# Patient Record
Sex: Male | Born: 1955 | Race: White | Hispanic: No | Marital: Married | Smoking: Never smoker
Health system: Southern US, Community
[De-identification: ages and names within clinical notes are randomized; demographics above are authoritative.]

## PROBLEM LIST (undated history)

## (undated) DIAGNOSIS — E785 Hyperlipidemia, unspecified: Secondary | ICD-10-CM

## (undated) DIAGNOSIS — Z889 Allergy status to unspecified drugs, medicaments and biological substances status: Secondary | ICD-10-CM

## (undated) HISTORY — PX: WRIST SURGERY: SHX841

## (undated) HISTORY — DX: Hyperlipidemia, unspecified: E78.5

## (undated) HISTORY — DX: Allergy status to unspecified drugs, medicaments and biological substances: Z88.9

---

## 2004-09-24 ENCOUNTER — Emergency Department: Payer: Self-pay | Admitting: General Practice

## 2005-12-27 ENCOUNTER — Ambulatory Visit: Payer: Self-pay | Admitting: Unknown Physician Specialty

## 2011-01-25 ENCOUNTER — Ambulatory Visit: Payer: Self-pay | Admitting: Unknown Physician Specialty

## 2011-01-28 LAB — PATHOLOGY REPORT

## 2012-12-28 ENCOUNTER — Ambulatory Visit: Payer: Self-pay | Admitting: Family Medicine

## 2013-03-26 ENCOUNTER — Ambulatory Visit: Payer: Self-pay | Admitting: Unknown Physician Specialty

## 2013-03-26 LAB — HM COLONOSCOPY

## 2013-08-24 ENCOUNTER — Ambulatory Visit: Payer: Self-pay | Admitting: Family Medicine

## 2014-08-20 IMAGING — CT CT ABD-PELV W/ CM
2 of 5 series · 17 of 46 positions shown, 19 images · IV contrast (isovue)
Comparison: None.

CLINICAL DATA: Left upper quadrant abdominal pain x1 month, history
of diverticulitis

EXAM:
CT ABDOMEN AND PELVIS WITH CONTRAST
TECHNIQUE: Multidetector CT imaging of the abdomen and pelvis was performed
using the standard protocol following bolus administration of
intravenous contrast.
CONTRAST:  100 mL Isovue 370 IV

[Series 2: axial soft tissue · axial · 0.86mm/px · z∈[-876,-476]mm · 14 of 90 slices shown, 16 images]
[im 5/90  soft-tissue]
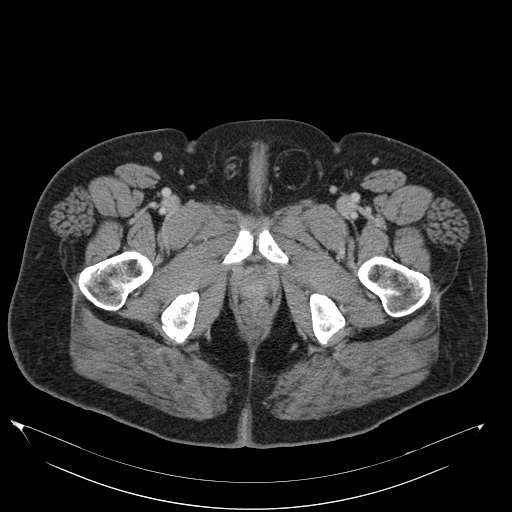
[im 5/90  bone]
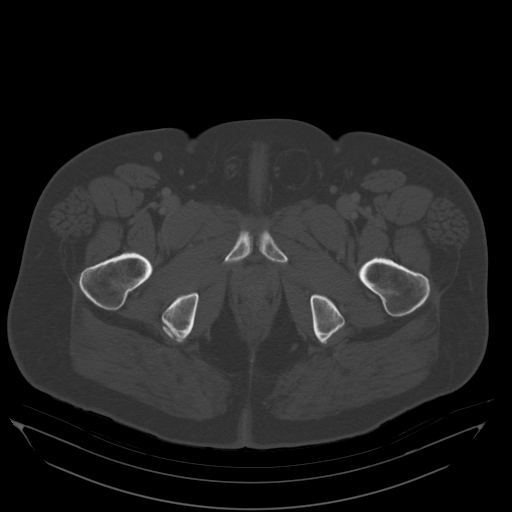
[im 10/90  soft-tissue]
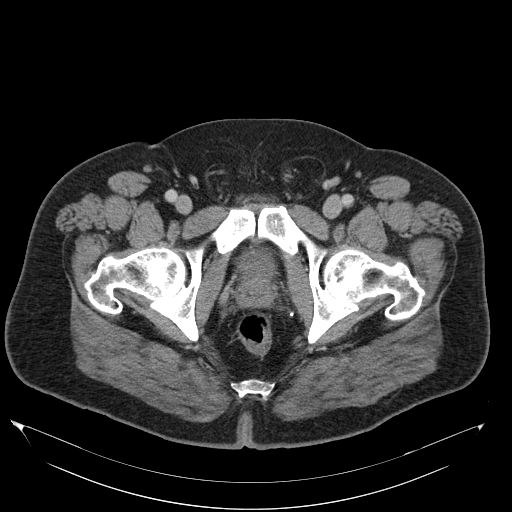
[im 19/90  soft-tissue]
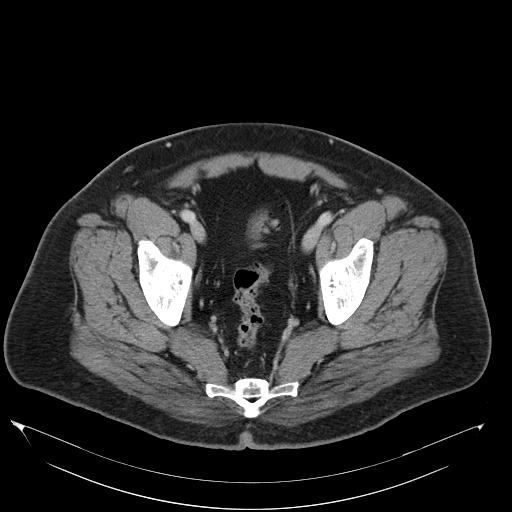
[im 24/90  soft-tissue]
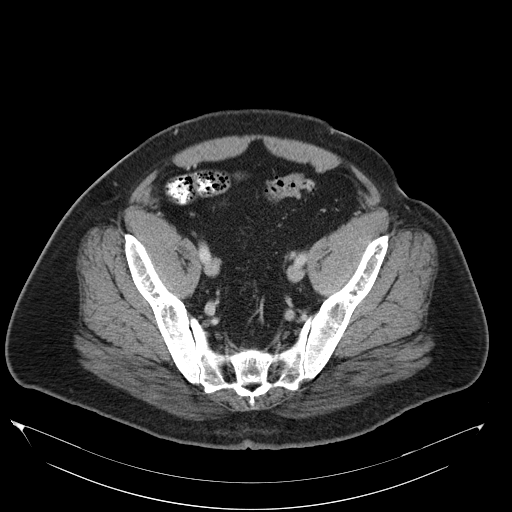
[im 29/90  soft-tissue]
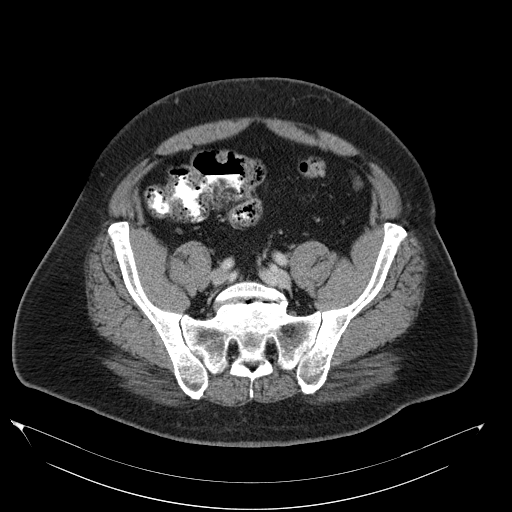
[im 38/90  soft-tissue]
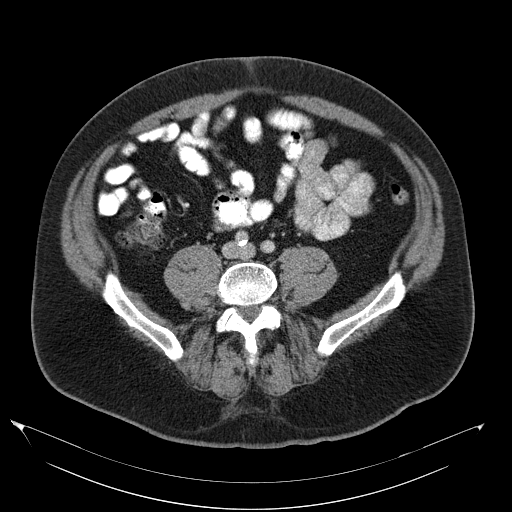
[im 43/90  soft-tissue]
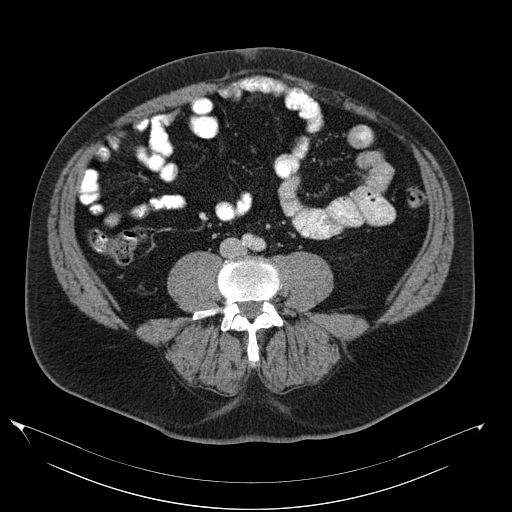
[im 47/90  soft-tissue]
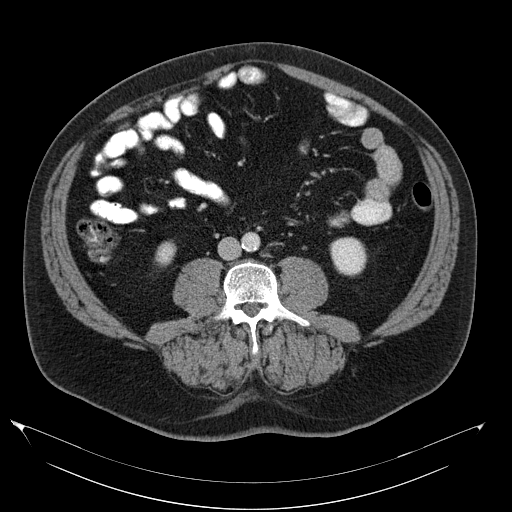
[im 52/90  soft-tissue]
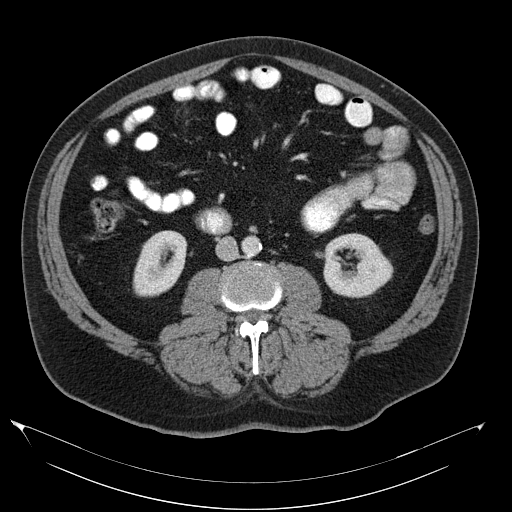
[im 52/90  bone]
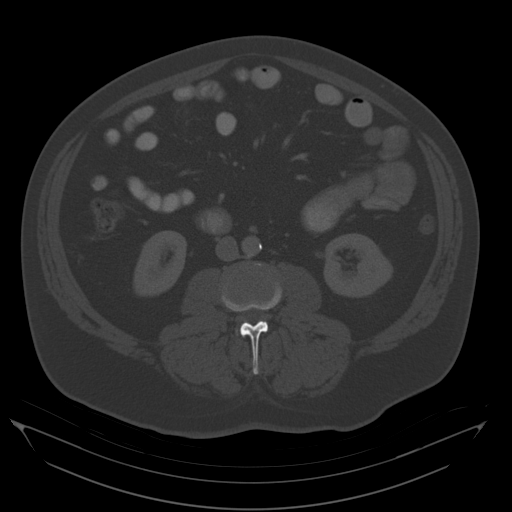
[im 61/90  soft-tissue]
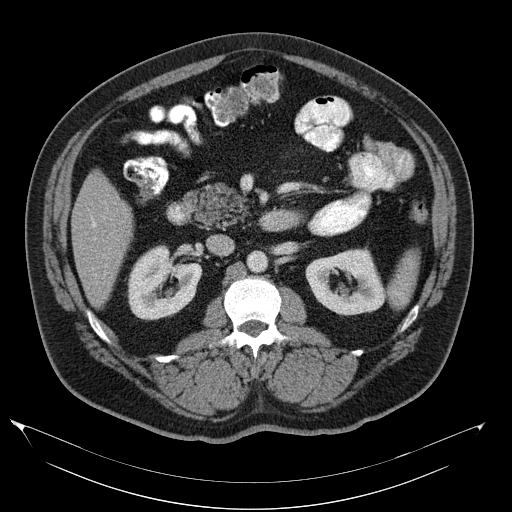
[im 66/90  soft-tissue]
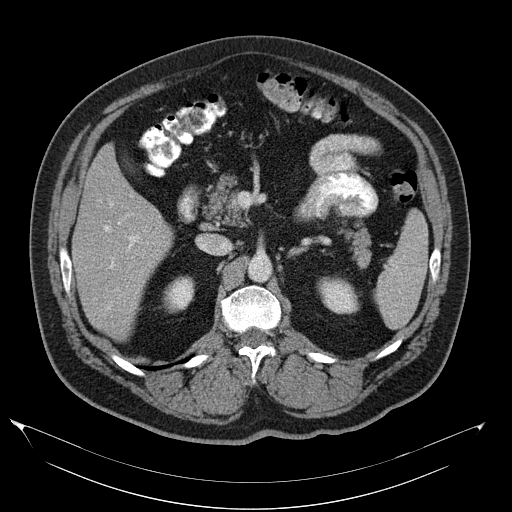
[im 71/90  soft-tissue]
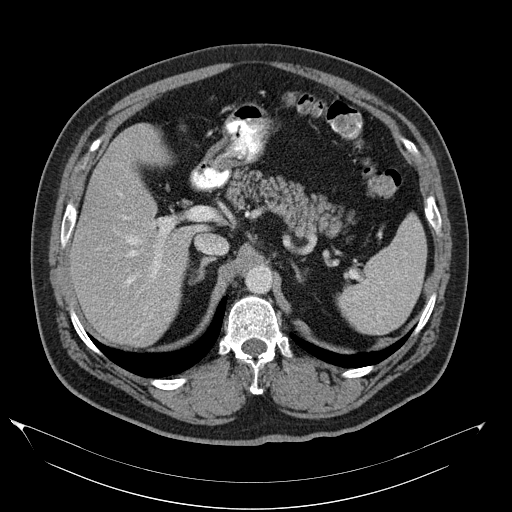
[im 80/90  soft-tissue]
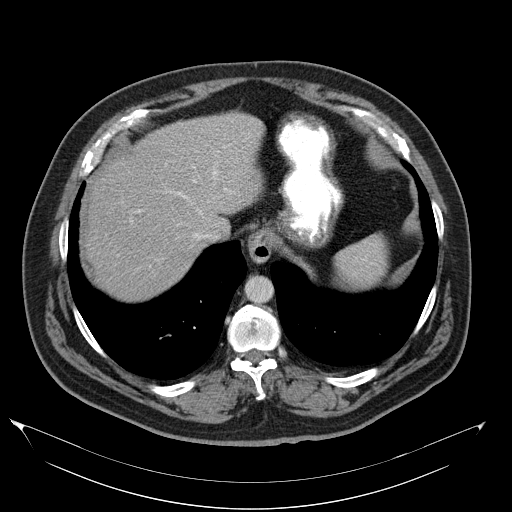
[im 85/90  soft-tissue]
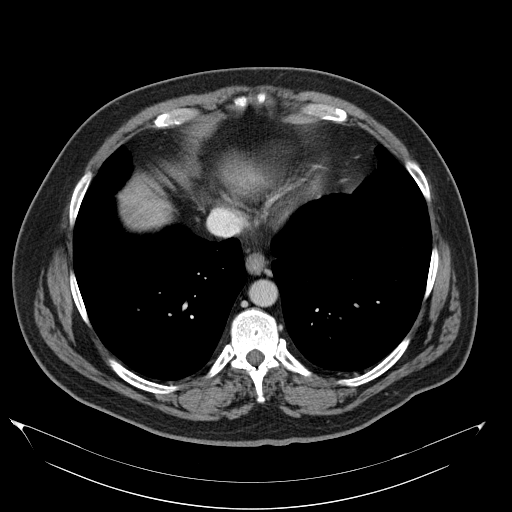

[Series 602: coronals · coronal · 0.88mm/px · 3 of 177 slices shown]
[im 59/177  soft-tissue]
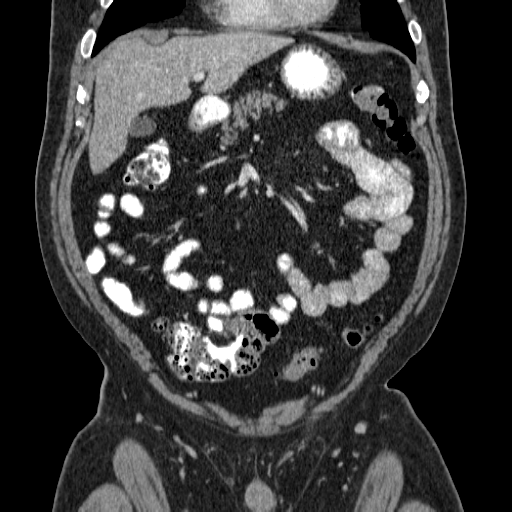
[im 79/177  soft-tissue]
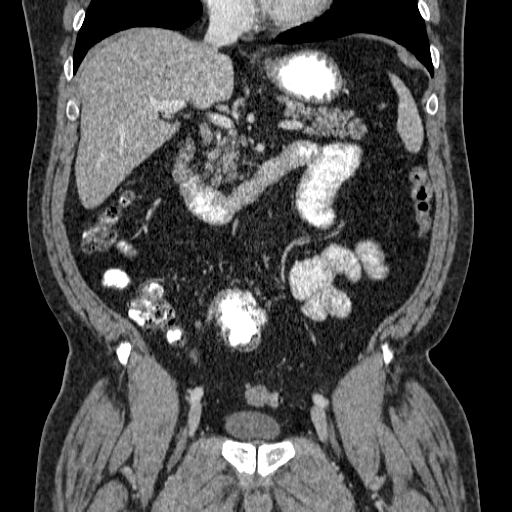
[im 98/177  soft-tissue]
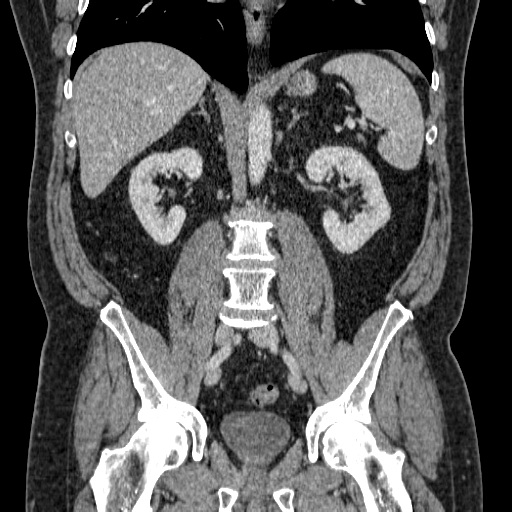

[17 of 46 positions shown; findings below may reference images not displayed]

FINDINGS: Lung bases are clear.

Tiny hiatal hernia.

Liver, spleen, pancreas, and adrenal glands are within normal
limits.

Gallbladder is unremarkable. No intrahepatic or extrahepatic ductal
dilatation.

Kidneys are within normal limits.  No hydronephrosis.

No evidence of bowel obstruction. Normal appendix. Colonic
diverticulosis, without associated inflammatory changes to suggest
diverticulitis.

Atherosclerotic calcifications of the abdominal aorta and branch
vessels.

No abdominopelvic ascites.

No suspicious abdominopelvic lymphadenopathy.

Prostate is unremarkable.

Bladder is within normal limits.

Asymmetric fat within the left inguinal canal.

Mild degenerative changes of the lower lumbar spine. Grade 1
anterolisthesis at L5-S1.
IMPRESSION: Colonic diverticulosis, without findings suggest diverticulitis.

No evidence of bowel obstruction.  Normal appendix.

No CT findings to account for the patient's left upper quadrant
abdominal pain.

## 2015-05-04 ENCOUNTER — Ambulatory Visit (INDEPENDENT_AMBULATORY_CARE_PROVIDER_SITE_OTHER): Payer: PRIVATE HEALTH INSURANCE

## 2015-05-04 DIAGNOSIS — Z23 Encounter for immunization: Secondary | ICD-10-CM | POA: Diagnosis not present

## 2015-05-23 ENCOUNTER — Telehealth: Payer: Self-pay | Admitting: Family Medicine

## 2015-09-18 ENCOUNTER — Encounter: Payer: Self-pay | Admitting: Family Medicine

## 2015-09-18 ENCOUNTER — Ambulatory Visit (INDEPENDENT_AMBULATORY_CARE_PROVIDER_SITE_OTHER): Payer: PRIVATE HEALTH INSURANCE | Admitting: Family Medicine

## 2015-09-18 VITALS — BP 138/70 | HR 80 | Temp 98.7°F | Resp 16 | Wt 246.8 lb

## 2015-09-18 DIAGNOSIS — J301 Allergic rhinitis due to pollen: Secondary | ICD-10-CM | POA: Diagnosis not present

## 2015-09-18 DIAGNOSIS — Z20828 Contact with and (suspected) exposure to other viral communicable diseases: Secondary | ICD-10-CM

## 2015-09-18 DIAGNOSIS — E785 Hyperlipidemia, unspecified: Secondary | ICD-10-CM | POA: Diagnosis not present

## 2015-09-18 DIAGNOSIS — B349 Viral infection, unspecified: Secondary | ICD-10-CM

## 2015-09-18 DIAGNOSIS — J309 Allergic rhinitis, unspecified: Secondary | ICD-10-CM | POA: Insufficient documentation

## 2015-09-18 LAB — POCT INFLUENZA A/B
INFLUENZA A, POC: NEGATIVE
Influenza B, POC: NEGATIVE

## 2015-09-18 MED ORDER — OSELTAMIVIR PHOSPHATE 75 MG PO CAPS: 75.0000 mg | ORAL_CAPSULE | Freq: Two times a day (BID) | ORAL | Status: DC

## 2015-09-18 MED ORDER — HYDROCODONE-HOMATROPINE 5-1.5 MG/5ML PO SYRP: ORAL_SOLUTION | ORAL | Status: DC

## 2015-09-18 NOTE — Patient Instructions (Signed)
Stay out of work until no fever for 24 hours.

## 2015-09-18 NOTE — Progress Notes (Signed)
Subjective:     Patient ID: Jorge Knox, male   DOB: 1955-12-16, 60 y.o.   MRN: [id number]  HPI  Chief Complaint  Patient presents with  . Cough    Patient comes in office today with concerns of cough and chest pain for the past 24 hrs. Patient reports that over weekend he took his family over to [place], he states that his son began showing similar symptoms as well yesterday upon returning from [place] and he came back positive for flu.   +flu shot this season:"I feel bad"   Review of Systems     Objective:   Physical Exam  Constitutional: He appears well-developed and well-nourished. He has a sickly appearance. No distress.  Ears: T.M's intact without inflammation Sinuses: non-tender Throat: no tonsillar enlargement or exudate Neck: mild anterior cervical tenderness Lungs: clear     Assessment:    1. Exposure to influenza - POCT Influenza A/B  2. Viral syndrome - oseltamivir (TAMIFLU) 75 MG capsule; Take 1 capsule (75 mg total) by mouth 2 (two) times daily.  Dispense: 10 capsule; Refill: 0 - HYDROcodone-homatropine (HYCODAN) 5-1.5 MG/5ML syrup; 5 ml 4-6 hours as needed for cough  Dispense: 240 mL; Refill: 0    Plan:    Work excuse for 3/6-3/10/17.

## 2015-09-21 ENCOUNTER — Telehealth: Payer: Self-pay | Admitting: Family Medicine

## 2015-09-21 NOTE — Telephone Encounter (Signed)
Please review office note and advise. KW 

## 2015-09-21 NOTE — Telephone Encounter (Signed)
Pt states he came in Monday with a cough.  Pt states his cough is better but he is still having congestion.  Pt is coughing up green phlegm.  Pt is requesting a Rx to help with this.  [facility].  [phone]

## 2015-09-21 NOTE — Telephone Encounter (Signed)
Discussed use of expectorants.Feeling much better without a fever.

## 2016-02-19 NOTE — Telephone Encounter (Signed)
error 

## 2017-08-21 DIAGNOSIS — C4491 Basal cell carcinoma of skin, unspecified: Secondary | ICD-10-CM

## 2017-08-21 HISTORY — DX: Basal cell carcinoma of skin, unspecified: C44.91

## 2018-09-04 ENCOUNTER — Encounter: Payer: Self-pay | Admitting: Family Medicine

## 2018-09-04 ENCOUNTER — Ambulatory Visit (INDEPENDENT_AMBULATORY_CARE_PROVIDER_SITE_OTHER): Payer: PRIVATE HEALTH INSURANCE | Admitting: Family Medicine

## 2018-09-04 ENCOUNTER — Other Ambulatory Visit: Payer: Self-pay | Admitting: Family Medicine

## 2018-09-04 VITALS — BP 134/78 | HR 66 | Temp 98.5°F | Resp 16 | Ht 70.0 in | Wt 256.2 lb

## 2018-09-04 DIAGNOSIS — Z0000 Encounter for general adult medical examination without abnormal findings: Secondary | ICD-10-CM | POA: Diagnosis not present

## 2018-09-04 DIAGNOSIS — Z1211 Encounter for screening for malignant neoplasm of colon: Secondary | ICD-10-CM | POA: Diagnosis not present

## 2018-09-04 DIAGNOSIS — C4491 Basal cell carcinoma of skin, unspecified: Secondary | ICD-10-CM | POA: Insufficient documentation

## 2018-09-04 DIAGNOSIS — Z131 Encounter for screening for diabetes mellitus: Secondary | ICD-10-CM

## 2018-09-04 DIAGNOSIS — E782 Mixed hyperlipidemia: Secondary | ICD-10-CM

## 2018-09-04 DIAGNOSIS — Z125 Encounter for screening for malignant neoplasm of prostate: Secondary | ICD-10-CM

## 2018-09-04 NOTE — Patient Instructions (Signed)
Please get the labs fasting-we will call you with the lab results. You should get a call from Dr. Percell Boston office. Think about getting an eye exam this year and the Shingles shot.

## 2018-09-04 NOTE — Progress Notes (Signed)
  Subjective:     Patient ID: Jorge Knox, male   DOB: 1955-07-22, 63 y.o.   MRN: [id number] Chief Complaint  Patient presents with  . Annual Exam    Patient comes in office today for his annual physical, patient reports that he feels well today and has no concerns to address. Patient reports that he follows a well balanced diet and is staying active by walking daily, patient sleeps on average 7-8hrs and states Libido is normal. Patient last reported Tdap vaccine was 04/19/2011 , he states that he has had the Flu vaccine. Colonoscopy was last done on 03/26/13 and is due. Patient states that he will contact insurance about Shingrix vaccine.    HPI Continues to work at [employer]. "You won't find anything wrong with me."  Review of Systems General: Feeling well HEENT: regular dental visits. Recommended baseline eye exam for glaucoma and cataract screening. Cardiovascular: no chest pain, shortness of breath, or palpitations GI: no heartburn, no change in bowel habits or blood in the stool. He was due 5 year f/u colonoscopy in 2019. GU: nocturia x-0 1, no change in bladder habits  Psychiatric: not depressed Musculoskeletal: no joint pain    Objective:   Physical Exam Constitutional:      General: He is not in acute distress. Neurological:     Mental Status: He is alert.   Eyes: PERRLA, EOMI Neck: no thyromegaly, tenderness or nodules, L anterior cervical node, no carotid bruits ENT: TM's intact without inflammation; No tonsillar enlargement or exudate, Lungs: Clear Heart : RRR without murmur or gallop Abd: bowel sounds present, soft, non-tender, no organomegaly Rectal: deferred due to patient body habitus and preference Extremities: no edema, Skin: no atypical lesions noted on his back     Assessment:    1. Mixed hyperlipidemia - Lipid panel  2. Annual physical exam  3. Screen for colon cancer - Ambulatory referral to Gastroenterology  4. Screening  for diabetes mellitus - Comprehensive metabolic panel  5. Screening for prostate cancer - PSA    Plan:    Further f/u pending lab results

## 2018-09-07 DIAGNOSIS — M6208 Separation of muscle (nontraumatic), other site: Secondary | ICD-10-CM | POA: Insufficient documentation

## 2018-09-10 ENCOUNTER — Telehealth: Payer: Self-pay

## 2018-09-10 LAB — COMPREHENSIVE METABOLIC PANEL
ALT: 34 IU/L (ref 0–44)
AST: 23 IU/L (ref 0–40)
Albumin/Globulin Ratio: 1.9 (ref 1.2–2.2)
Albumin: 4.3 g/dL (ref 3.8–4.8)
Alkaline Phosphatase: 54 IU/L (ref 39–117)
BUN/Creatinine Ratio: 10 (ref 10–24)
BUN: 11 mg/dL (ref 8–27)
Bilirubin Total: 0.8 mg/dL (ref 0.0–1.2)
CO2: 19 mmol/L — ABNORMAL LOW (ref 20–29)
Calcium: 9.5 mg/dL (ref 8.6–10.2)
Chloride: 105 mmol/L (ref 96–106)
Creatinine, Ser: 1.12 mg/dL (ref 0.76–1.27)
GFR calc Af Amer: 82 mL/min/{1.73_m2} (ref >59)
GFR calc non Af Amer: 71 mL/min/{1.73_m2} (ref >59)
Globulin, Total: 2.3 g/dL (ref 1.5–4.5)
Glucose: 96 mg/dL (ref 65–99)
POTASSIUM: 4.7 mmol/L (ref 3.5–5.2)
Sodium: 141 mmol/L (ref 134–144)
Total Protein: 6.6 g/dL (ref 6.0–8.5)

## 2018-09-10 LAB — LIPID PANEL
CHOL/HDL RATIO: 5.6 ratio — AB (ref 0.0–5.0)
Cholesterol, Total: 179 mg/dL (ref 100–199)
HDL: 32 mg/dL — ABNORMAL LOW (ref >39)
LDL Calculated: 133 mg/dL — ABNORMAL HIGH (ref 0–99)
TRIGLYCERIDES: 70 mg/dL (ref 0–149)
VLDL Cholesterol Cal: 14 mg/dL (ref 5–40)

## 2018-09-10 LAB — PSA: Prostate Specific Ag, Serum: 1.2 ng/mL (ref 0.0–4.0)

## 2018-09-10 NOTE — Telephone Encounter (Signed)
-----   Message from Carmon Ginsberg, [place] sent at 09/10/2018  7:27 AM EST ----- Your cholesterol is mildly elevated; your other labs are fine. Your calculated 10 year risk for developing cardiovascular is 12.4%. This is above the 7.5% risk when we recommend a cholesterol lowering medication. In your case your age pushes the risk up. Do you wish to work on low fat food choices (Mediterraneand diet) or start medication?

## 2018-09-10 NOTE — Telephone Encounter (Signed)
[id number] 

## 2018-09-21 NOTE — Telephone Encounter (Signed)
[id number] 

## 2018-09-28 ENCOUNTER — Telehealth: Payer: Self-pay | Admitting: Family Medicine

## 2018-09-28 NOTE — Telephone Encounter (Signed)
Pt was in about two weeks ago and had labs.  He has not heard anything back from them  CB# [phone]  Thanks teri

## 2018-09-29 NOTE — Telephone Encounter (Signed)
Notes recorded by Carmon Ginsberg, PA on 09/10/2018 at 7:27 AM EST Your cholesterol is mildly elevated; your other labs are fine. Your calculated 10 year risk for developing cardiovascular is 12.4%. This is above the 7.5% risk when we recommend a cholesterol lowering medication. In your case your age pushes the risk up. Do you wish to work on low fat food choices (Mediterraneand diet) or start medication?   Pt advised of lab results.  He decided to work on lifestyle changes before starting a cholesterol medication.   Thanks,   -Mickel Baas

## 2020-05-03 NOTE — Progress Notes (Signed)
Complete physical exam   Patient: Jorge Knox   DOB: 05/08/1956   64 y.o. Male  MRN: [id number] Visit Date: 05/04/2020  Today's healthcare provider: Vernie Murders, PA   Chief Complaint  Patient presents with  . Annual Exam   Subjective    Jorge Knox is a 64 y.o. male who presents today for a complete physical exam.  He reports consuming a general diet. The patient does not participate in regular exercise at present. He generally feels well. He reports sleeping well. He does not have additional problems to discuss today.     Past Medical History:  Diagnosis Date  . H/O seasonal allergies   . Hyperlipidemia    Past Surgical History:  Procedure Laterality Date  . WRIST SURGERY Right    pinning   Social History   Socioeconomic History  . Marital status: Married    Spouse name: Not on file  . Number of children: Not on file  . Years of education: Not on file  . Highest education level: Not on file  Occupational History  . Not on file  Tobacco Use  . Smoking status: Never Smoker  . Smokeless tobacco: Never Used  Substance and Sexual Activity  . Alcohol use: Not on file  . Drug use: Not on file  . Sexual activity: Not on file  Other Topics Concern  . Not on file  Social History Narrative  . Not on file   Social Determinants of Health   Financial Resource Strain:   . Difficulty of Paying Living Expenses: Not on file  Food Insecurity:   . Worried About Charity fundraiser in the Last Year: Not on file  . Ran Out of Food in the Last Year: Not on file  Transportation Needs:   . Lack of Transportation (Medical): Not on file  . Lack of Transportation (Non-Medical): Not on file  Physical Activity:   . Days of Exercise per Week: Not on file  . Minutes of Exercise per Session: Not on file  Stress:   . Feeling of Stress : Not on file  Social Connections:   . Frequency of Communication with Friends and Family: Not on file  . Frequency of Social  Gatherings with Friends and Family: Not on file  . Attends Religious Services: Not on file  . Active Member of Clubs or Organizations: Not on file  . Attends Archivist Meetings: Not on file  . Marital Status: Not on file  Intimate Partner Violence:   . Fear of Current or Ex-Partner: Not on file  . Emotionally Abused: Not on file  . Physically Abused: Not on file  . Sexually Abused: Not on file   No family status information on file.   History reviewed. No pertinent family history. Allergies  Allergen Reactions  . Cefdinir Nausea Only    Patient Care Team: Lakota Markgraf, Vickki Muff, PA as PCP - General (Family Medicine)   Medications: Outpatient Medications Prior to Visit  Medication Sig  . Omega-3 Fatty Acids (FISH OIL BURP-LESS PO) Take by mouth daily.  . [DISCONTINUED] Multiple Vitamins-Minerals (MULTIVITAMIN ADULT PO) Take by mouth daily.   No facility-administered medications prior to visit.    Review of Systems  Constitutional: Negative.   HENT: Negative.   Eyes: Negative.   Respiratory: Negative.   Cardiovascular: Negative.   Gastrointestinal: Negative.   Endocrine: Negative.   Genitourinary: Negative.   Musculoskeletal: Negative.   Skin: Negative.   Allergic/Immunologic: Negative.  Neurological: Negative.   Hematological: Negative.   Psychiatric/Behavioral: Negative.       Objective    BP (!) 156/81 (BP Location: Right Arm, Patient Position: Sitting, Cuff Size: Large)   Pulse 68   Temp 98.1 F (36.7 C) (Oral)   Ht 5\' 10"  (1.778 m)   Wt 255 lb 9.6 oz (115.9 kg)   SpO2 97%   BMI 36.67 kg/m  BP Readings from Last 3 Encounters:  05/04/20 (!) 156/81  09/04/18 134/78  09/18/15 138/70   Wt Readings from Last 3 Encounters:  05/04/20 255 lb 9.6 oz (115.9 kg)  09/04/18 256 lb 3.2 oz (116.2 kg)  09/18/15 246 lb 12.8 oz (111.9 kg)    Physical Exam Constitutional:      Appearance: He is well-developed.  HENT:     Head: Normocephalic and  atraumatic.     Right Ear: External ear normal.     Left Ear: External ear normal.     Nose: Nose normal.  Eyes:     General:        Right eye: No discharge.     Conjunctiva/sclera: Conjunctivae normal.     Pupils: Pupils are equal, round, and reactive to light.  Neck:     Thyroid: No thyromegaly.     Trachea: No tracheal deviation.  Cardiovascular:     Rate and Rhythm: Normal rate and regular rhythm.     Heart sounds: Normal heart sounds. No murmur heard.   Pulmonary:     Effort: Pulmonary effort is normal. No respiratory distress.     Breath sounds: Normal breath sounds. No wheezing or rales.  Chest:     Chest wall: No tenderness.  Abdominal:     General: There is no distension.     Palpations: Abdomen is soft. There is no mass.     Tenderness: There is no abdominal tenderness. There is no guarding or rebound.  Genitourinary:    Comments: Refuses exam of genitals and DRE. Musculoskeletal:        General: No tenderness. Normal range of motion.     Cervical back: Normal range of motion and neck supple.  Lymphadenopathy:     Cervical: No cervical adenopathy.  Skin:    General: Skin is warm and dry.     Findings: No erythema or rash.  Neurological:     Mental Status: He is alert and oriented to person, place, and time.     Cranial Nerves: No cranial nerve deficit.     Motor: No abnormal muscle tone.     Coordination: Coordination normal.     Deep Tendon Reflexes: Reflexes are normal and symmetric. Reflexes normal.  Psychiatric:        Behavior: Behavior normal.        Thought Content: Thought content normal.        Judgment: Judgment normal.       Last depression screening scores PHQ 2/9 Scores 05/04/2020 09/04/2018  PHQ - 2 Score 0 0  PHQ- 9 Score - 0   Last fall risk screening Fall Risk  05/04/2020  Falls in the past year? 0  Number falls in past yr: 0  Injury with Fall? 0   Last Audit-C alcohol use screening Alcohol Use Disorder Test (AUDIT) 09/04/2018  1.  How often do you have a drink containing alcohol? 0  2. How many drinks containing alcohol do you have on a typical day when you are drinking? 0  3. How often do you have six  or more drinks on one occasion? 0  AUDIT-C Score 0   A score of 3 or more in women, and 4 or more in men indicates increased risk for alcohol abuse, EXCEPT if all of the points are from question 1   No results found for any visits on 05/04/20.  Assessment & Plan    Routine Health Maintenance and Physical Exam  Exercise Activities and Dietary recommendations Goals   None     Immunization History  Administered Date(s) Administered  . Influenza Inj Mdck Quad With Preservative 06/08/2018  . Influenza,inj,Quad PF,6+ Mos 05/04/2015  . Tdap 04/19/2011    Health Maintenance  Topic Date Due  . Hepatitis C Screening  Never done  . COVID-19 Vaccine (1) Never done  . HIV Screening  Never done  . INFLUENZA VACCINE  02/13/2020  . TETANUS/TDAP  04/18/2021  . COLONOSCOPY  03/27/2023    Discussed health benefits of physical activity, and encouraged him to engage in regular exercise appropriate for his age and condition.  1. Annual physical exam Stable general health. Immunizations up to date with flu shot today. Given anticipatory counseling and get routine labs. - CBC with Differential - Comprehensive Metabolic Panel (CMET) - TSH - PSA - Lipid Profile  2. Mixed hyperlipidemia Still working on low fat diet. Needs to lose weight. Continues to take Omega-3 Fish Oil daily. Recheck labs. - Comprehensive Metabolic Panel (CMET) - TSH - Lipid Profile  3. Screening for diabetes mellitus No polyuria, polydipsia, vision changes or polyphagia. BMI over 36. Check labs. - Comprehensive Metabolic Panel (CMET)  4. Screening for prostate cancer No nocturia, frequency or decreased stream. Refuses DRE or genital exam. - PSA  5. Screen for colon cancer Last colonoscopy in 2014 by Dr. Vira Agar (GI) and advised to repeat in  5-10 years. Wants to proceed with repeat surveillance. Asymptomatic. - Ambulatory referral to Gastroenterology  6. Needs flu shot - Flu Vaccine QUAD 6+ mos PF IM (Fluarix Quad PF)  7. Need for hepatitis C screening test - Hepatitis C antibody  8. Screening for HIV (human immunodeficiency virus) - HIV Antibody (routine testing w rflx)   No follow-ups on file.     Andres Shad, PA, have reviewed all documentation for this visit. The documentation on 05/04/20 for the exam, diagnosis, procedures, and orders are all accurate and complete.    Vernie Murders, [place] [phone] (phone) [phone] (fax)  [place]

## 2020-05-04 ENCOUNTER — Ambulatory Visit (INDEPENDENT_AMBULATORY_CARE_PROVIDER_SITE_OTHER): Payer: PRIVATE HEALTH INSURANCE | Admitting: Family Medicine

## 2020-05-04 ENCOUNTER — Other Ambulatory Visit: Payer: Self-pay

## 2020-05-04 ENCOUNTER — Encounter: Payer: Self-pay | Admitting: Family Medicine

## 2020-05-04 VITALS — BP 156/81 | HR 68 | Temp 98.1°F | Ht 70.0 in | Wt 255.6 lb

## 2020-05-04 DIAGNOSIS — Z125 Encounter for screening for malignant neoplasm of prostate: Secondary | ICD-10-CM

## 2020-05-04 DIAGNOSIS — Z0000 Encounter for general adult medical examination without abnormal findings: Secondary | ICD-10-CM | POA: Diagnosis not present

## 2020-05-04 DIAGNOSIS — Z1211 Encounter for screening for malignant neoplasm of colon: Secondary | ICD-10-CM

## 2020-05-04 DIAGNOSIS — Z114 Encounter for screening for human immunodeficiency virus [HIV]: Secondary | ICD-10-CM

## 2020-05-04 DIAGNOSIS — Z131 Encounter for screening for diabetes mellitus: Secondary | ICD-10-CM | POA: Diagnosis not present

## 2020-05-04 DIAGNOSIS — Z1159 Encounter for screening for other viral diseases: Secondary | ICD-10-CM

## 2020-05-04 DIAGNOSIS — E782 Mixed hyperlipidemia: Secondary | ICD-10-CM

## 2020-05-04 DIAGNOSIS — Z23 Encounter for immunization: Secondary | ICD-10-CM

## 2020-05-08 ENCOUNTER — Other Ambulatory Visit: Payer: Self-pay | Admitting: Family Medicine

## 2020-05-09 LAB — LIPID PANEL
Chol/HDL Ratio: 5.3 ratio — ABNORMAL HIGH (ref 0.0–5.0)
Cholesterol, Total: 170 mg/dL (ref 100–199)
HDL: 32 mg/dL — ABNORMAL LOW (ref >39)
LDL Chol Calc (NIH): 124 mg/dL — ABNORMAL HIGH (ref 0–99)
Triglycerides: 76 mg/dL (ref 0–149)
VLDL Cholesterol Cal: 14 mg/dL (ref 5–40)

## 2020-05-09 LAB — COMPREHENSIVE METABOLIC PANEL
ALT: 42 IU/L (ref 0–44)
AST: 21 IU/L (ref 0–40)
Albumin/Globulin Ratio: 1.7 (ref 1.2–2.2)
Albumin: 4.2 g/dL (ref 3.8–4.8)
Alkaline Phosphatase: 68 IU/L (ref 44–121)
BUN/Creatinine Ratio: 10 (ref 10–24)
BUN: 11 mg/dL (ref 8–27)
Bilirubin Total: 0.7 mg/dL (ref 0.0–1.2)
CO2: 22 mmol/L (ref 20–29)
Calcium: 9.7 mg/dL (ref 8.6–10.2)
Chloride: 104 mmol/L (ref 96–106)
Creatinine, Ser: 1.07 mg/dL (ref 0.76–1.27)
GFR calc Af Amer: 86 mL/min/{1.73_m2} (ref >59)
GFR calc non Af Amer: 74 mL/min/{1.73_m2} (ref >59)
Globulin, Total: 2.5 g/dL (ref 1.5–4.5)
Glucose: 103 mg/dL — ABNORMAL HIGH (ref 65–99)
Potassium: 4.8 mmol/L (ref 3.5–5.2)
Sodium: 139 mmol/L (ref 134–144)
Total Protein: 6.7 g/dL (ref 6.0–8.5)

## 2020-05-09 LAB — CBC WITH DIFFERENTIAL/PLATELET
Basophils Absolute: 0.1 10*3/uL (ref 0.0–0.2)
Basos: 1 %
EOS (ABSOLUTE): 0.2 10*3/uL (ref 0.0–0.4)
Eos: 3 %
Hematocrit: 49.4 % (ref 37.5–51.0)
Hemoglobin: 17.1 g/dL (ref 13.0–17.7)
Immature Grans (Abs): 0 10*3/uL (ref 0.0–0.1)
Immature Granulocytes: 0 %
Lymphocytes Absolute: 1.4 10*3/uL (ref 0.7–3.1)
Lymphs: 23 %
MCH: 32.1 pg (ref 26.6–33.0)
MCHC: 34.6 g/dL (ref 31.5–35.7)
MCV: 93 fL (ref 79–97)
Monocytes Absolute: 0.5 10*3/uL (ref 0.1–0.9)
Monocytes: 9 %
Neutrophils Absolute: 3.7 10*3/uL (ref 1.4–7.0)
Neutrophils: 64 %
Platelets: 234 10*3/uL (ref 150–450)
RBC: 5.33 x10E6/uL (ref 4.14–5.80)
RDW: 12 % (ref 11.6–15.4)
WBC: 5.8 10*3/uL (ref 3.4–10.8)

## 2020-05-09 LAB — HIV ANTIBODY (ROUTINE TESTING W REFLEX): HIV Screen 4th Generation wRfx: NONREACTIVE

## 2020-05-09 LAB — HEPATITIS C ANTIBODY: Hep C Virus Ab: <0.1 s/co ratio (ref 0.0–0.9)

## 2020-05-09 LAB — PSA: Prostate Specific Ag, Serum: 1.3 ng/mL (ref 0.0–4.0)

## 2020-05-09 LAB — TSH: TSH: 3.22 u[IU]/mL (ref 0.450–4.500)

## 2020-05-16 ENCOUNTER — Encounter: Payer: Self-pay | Admitting: *Deleted

## 2020-05-31 ENCOUNTER — Other Ambulatory Visit: Payer: Self-pay

## 2020-05-31 ENCOUNTER — Telehealth: Payer: Self-pay

## 2020-05-31 MED ORDER — ATORVASTATIN CALCIUM 20 MG PO TABS: 20.0000 mg | ORAL_TABLET | Freq: Every day | ORAL | 3 refills | Status: DC

## 2020-05-31 NOTE — Telephone Encounter (Signed)
Copied from [place] [id number]. Topic: General - Other >> May 31, 2020 10:22 AM Alanda Slim E wrote: Reason for CRM: Pt would like a call with lab results / Pt gave his correct phone number / please advise

## 2020-06-26 ENCOUNTER — Other Ambulatory Visit: Payer: Self-pay

## 2020-06-26 ENCOUNTER — Telehealth (INDEPENDENT_AMBULATORY_CARE_PROVIDER_SITE_OTHER): Payer: Self-pay | Admitting: Gastroenterology

## 2020-06-26 DIAGNOSIS — Z1211 Encounter for screening for malignant neoplasm of colon: Secondary | ICD-10-CM

## 2020-06-26 MED ORDER — NA SULFATE-K SULFATE-MG SULF 17.5-3.13-1.6 GM/177ML PO SOLN: 1.0000 | Freq: Once | ORAL | 0 refills | Status: AC

## 2020-06-26 NOTE — Progress Notes (Signed)
Gastroenterology Pre-Procedure Review  Request Date: Friday 08/11/20 Requesting Physician: Dr. Bonna Gains  PATIENT REVIEW QUESTIONS: The patient responded to the following health history questions as indicated:    1. Are you having any GI issues? no 2. Do you have a personal history of Polyps? no 3. Do you have a family history of Colon Cancer or Polyps? no 4. Diabetes Mellitus? no 5. Joint replacements in the past 12 months?no 6. Major health problems in the past 3 months?no 7. Any artificial heart valves, MVP, or defibrillator?no    MEDICATIONS & ALLERGIES:    Patient reports the following regarding taking any anticoagulation/antiplatelet therapy:   Plavix, Coumadin, Eliquis, Xarelto, Lovenox, Pradaxa, Brilinta, or Effient? no Aspirin? no  Patient confirms/reports the following medications:  Current Outpatient Medications  Medication Sig Dispense Refill  . atorvastatin (LIPITOR) 20 MG tablet Take 1 tablet (20 mg total) by mouth daily. 90 tablet 3  . Omega-3 Fatty Acids (FISH OIL BURP-LESS PO) Take by mouth daily.    . Na Sulfate-K Sulfate-Mg Sulf 17.5-3.13-1.6 GM/177ML SOLN Take 1 kit by mouth once for 1 dose. 354 mL 0   No current facility-administered medications for this visit.    Patient confirms/reports the following allergies:  Allergies  Allergen Reactions  . Cefdinir Nausea Only    No orders of the defined types were placed in this encounter.   AUTHORIZATION INFORMATION Primary Insurance: 1D#: Group #:  Secondary Insurance: 1D#: Group #:  SCHEDULE INFORMATION: Date: 08/11/20 Time: Location:[facility]

## 2020-06-27 ENCOUNTER — Other Ambulatory Visit: Payer: Self-pay

## 2020-06-27 DIAGNOSIS — Z1211 Encounter for screening for malignant neoplasm of colon: Secondary | ICD-10-CM

## 2020-06-27 MED ORDER — GAVILYTE-C 240 G PO SOLR: 4000.0000 mL | Freq: Once | ORAL | 0 refills | Status: AC

## 2020-06-27 NOTE — Progress Notes (Signed)
Suprep was too expensive.  Bowel prep has been changed to [place].  Patient notified of rx change.  Asked him to call me back if this is too expensive as well.  Thanks,  [place], [place]

## 2020-08-09 ENCOUNTER — Other Ambulatory Visit
Admission: RE | Admit: 2020-08-09 | Discharge: 2020-08-09 | Disposition: A | Discharge status: Home / Self Care | Payer: PRIVATE HEALTH INSURANCE | Source: Ambulatory Visit | Attending: Gastroenterology | Admitting: Gastroenterology

## 2020-08-09 ENCOUNTER — Other Ambulatory Visit: Payer: Self-pay

## 2020-08-09 DIAGNOSIS — U071 COVID-19: Secondary | ICD-10-CM | POA: Diagnosis not present

## 2020-08-09 DIAGNOSIS — Z01812 Encounter for preprocedural laboratory examination: Secondary | ICD-10-CM | POA: Insufficient documentation

## 2020-08-09 LAB — SARS CORONAVIRUS 2 (TAT 6-24 HRS): SARS Coronavirus 2: POSITIVE — AB

## 2020-08-10 ENCOUNTER — Telehealth: Payer: Self-pay

## 2020-08-10 NOTE — Telephone Encounter (Signed)
Error

## 2020-08-10 NOTE — Telephone Encounter (Signed)
Called to discuss with patient about COVID-19 symptoms and the use of one of the available treatments for those with mild to moderate Covid symptoms and at a high risk of hospitalization.  Pt appears to qualify for outpatient treatment due to co-morbid conditions and/or a member of an at-risk group in accordance with the FDA Emergency Use Authorization.    Symptom onset: Mild cough 08/07/20, which has stopped Vaccinated: Yes Booster? Yes Immunocompromised? No Qualifiers: Obesity  Pt. States "I'm fine, I don't have any symptoms today." Declines any treatments.   Jorge Knox

## 2020-08-22 ENCOUNTER — Other Ambulatory Visit: Payer: Self-pay

## 2020-08-22 ENCOUNTER — Telehealth: Payer: Self-pay | Admitting: Family Medicine

## 2020-08-22 DIAGNOSIS — Z1211 Encounter for screening for malignant neoplasm of colon: Secondary | ICD-10-CM

## 2020-08-22 NOTE — Telephone Encounter (Signed)
Copied from [place] [id number]. Topic: Quick Communication - Rx Refill/Question >> Aug 22, 2020  3:59 PM Mcneil, Jacinto Reap wrote: Pt stated each time the Rx was filled for 30 tablets  Medication: atorvastatin (LIPITOR) 20 MG tablet  Has the patient contacted their pharmacy? yes   Preferred Pharmacy (with phone number or street name): [id number] [place], [place]  Phone: [phone]   Fax: [phone]  Agent: Please be advised that RX refills may take up to 3 business days. We ask that you follow-up with your pharmacy.

## 2020-08-22 NOTE — Telephone Encounter (Signed)
Call to patient- states he does not have RF- per chart he was given RF in November for 1 year. Call to pharmacy- they state patient does have RF and is scheduled to be filled tomorrow- verified that patient should not have trouble getting RF- they state he has them.

## 2020-09-08 ENCOUNTER — Other Ambulatory Visit: Payer: Self-pay

## 2020-09-08 ENCOUNTER — Ambulatory Visit: Payer: PRIVATE HEALTH INSURANCE | Admitting: Certified Registered Nurse Anesthetist

## 2020-09-08 ENCOUNTER — Encounter: Payer: Self-pay | Admitting: Gastroenterology

## 2020-09-08 ENCOUNTER — Encounter
Admission: RE | Disposition: A | Discharge status: Home / Self Care | Payer: Self-pay | Source: Home / Self Care | Attending: Gastroenterology

## 2020-09-08 ENCOUNTER — Ambulatory Visit
Admission: RE | Admit: 2020-09-08 | Discharge: 2020-09-08 | Disposition: A | Discharge status: Home / Self Care | Payer: PRIVATE HEALTH INSURANCE | Attending: Gastroenterology | Admitting: Gastroenterology

## 2020-09-08 DIAGNOSIS — Z79899 Other long term (current) drug therapy: Secondary | ICD-10-CM | POA: Diagnosis not present

## 2020-09-08 DIAGNOSIS — Z881 Allergy status to other antibiotic agents status: Secondary | ICD-10-CM | POA: Insufficient documentation

## 2020-09-08 DIAGNOSIS — Z1211 Encounter for screening for malignant neoplasm of colon: Secondary | ICD-10-CM | POA: Insufficient documentation

## 2020-09-08 DIAGNOSIS — K5730 Diverticulosis of large intestine without perforation or abscess without bleeding: Secondary | ICD-10-CM | POA: Insufficient documentation

## 2020-09-08 DIAGNOSIS — Z86010 Personal history of colonic polyps: Secondary | ICD-10-CM | POA: Diagnosis not present

## 2020-09-08 DIAGNOSIS — K648 Other hemorrhoids: Secondary | ICD-10-CM | POA: Diagnosis not present

## 2020-09-08 DIAGNOSIS — K635 Polyp of colon: Secondary | ICD-10-CM | POA: Insufficient documentation

## 2020-09-08 HISTORY — PX: COLONOSCOPY WITH PROPOFOL: SHX5780

## 2020-09-08 SURGERY — COLONOSCOPY WITH PROPOFOL
Anesthesia: General

## 2020-09-08 MED ORDER — SODIUM CHLORIDE 0.9 % IV SOLN: INTRAVENOUS | Status: DC

## 2020-09-08 MED ORDER — PROPOFOL 10 MG/ML IV BOLUS
INTRAVENOUS | Status: AC
  Filled 2020-09-08: qty 20

## 2020-09-08 MED ORDER — LIDOCAINE HCL (CARDIAC) PF 100 MG/5ML IV SOSY
PREFILLED_SYRINGE | INTRAVENOUS | Status: DC | PRN
  Administered 2020-09-08: 100 mg via INTRAVENOUS

## 2020-09-08 MED ORDER — PROPOFOL 10 MG/ML IV BOLUS
INTRAVENOUS | Status: DC | PRN
  Administered 2020-09-08: 70 mg via INTRAVENOUS

## 2020-09-08 MED ORDER — PROPOFOL 500 MG/50ML IV EMUL
INTRAVENOUS | Status: DC | PRN
  Administered 2020-09-08: 150 ug/kg/min via INTRAVENOUS

## 2020-09-08 NOTE — H&P (Signed)
  Vonda Antigua, MD [address], [place], [place], [place], [address] [address], [place], [place], [place], [address] Phone: [phone]  Fax: [phone]  Primary Care Physician:  Margo Common, PA-C   Pre-Procedure History & Physical: HPI:  Jorge Knox is a 65 y.o. male is here for a colonoscopy.   Past Medical History:  Diagnosis Date  . H/O seasonal allergies   . Hyperlipidemia     Past Surgical History:  Procedure Laterality Date  . WRIST SURGERY Right    pinning    Prior to Admission medications   Medication Sig Start Date End Date Taking? Authorizing Provider  atorvastatin (LIPITOR) 20 MG tablet Take 1 tablet (20 mg total) by mouth daily. 05/31/20  Yes Chrismon, Vickki Muff, PA-C  Omega-3 Fatty Acids (FISH OIL BURP-LESS PO) Take by mouth daily. Patient not taking: Reported on 09/08/2020    [provider]    Allergies as of 06/26/2020 - Review Complete 06/26/2020  Allergen Reaction Noted  . Cefdinir Nausea Only 09/18/2015    History reviewed. No pertinent family history.  Social History   Socioeconomic History  . Marital status: Married    Spouse name: Not on file  . Number of children: Not on file  . Years of education: Not on file  . Highest education level: Not on file  Occupational History  . Not on file  Tobacco Use  . Smoking status: Never Smoker  . Smokeless tobacco: Never Used  Substance and Sexual Activity  . Alcohol use: Not Currently    Alcohol/week: 0.0 standard drinks  . Drug use: Never  . Sexual activity: Not on file  Other Topics Concern  . Not on file  Social History Narrative  . Not on file   Social Determinants of Health   Financial Resource Strain: Not on file  Food Insecurity: Not on file  Transportation Needs: Not on file  Physical Activity: Not on file  Stress: Not on file  Social Connections: Not on file  Intimate Partner Violence: Not on file    Review of Systems: See HPI, otherwise  negative ROS  Physical Exam: BP (!) 164/95   Pulse 66   Temp (!) 96.9 F (36.1 C) (Temporal)   Resp 18   Ht 5\' 11"  (1.803 m)   Wt 108.9 kg   SpO2 98%   BMI 33.47 kg/m  General:   Alert,  pleasant and cooperative in NAD Head:  Normocephalic and atraumatic. Neck:  Supple; no masses or thyromegaly. Lungs:  Clear throughout to auscultation, normal respiratory effort.    Heart:  +S1, +S2, Regular rate and rhythm, No edema. Abdomen:  Soft, nontender and nondistended. Normal bowel sounds, without guarding, and without rebound.   Neurologic:  Alert and  oriented x4;  grossly normal neurologically.  Impression/Plan: DENHAM MOSE is here for a colonoscopy to be performed for history of adenoma polyps in 2012  Risks, benefits, limitations, and alternatives regarding  colonoscopy have been reviewed with the patient.  Questions have been answered.  All parties agreeable.   Virgel Manifold, MD  09/08/2020, 10:19 AM

## 2020-09-08 NOTE — Transfer of Care (Signed)
Immediate Anesthesia Transfer of Care Note  Patient: Jorge Knox  Procedure(s) Performed: COLONOSCOPY WITH PROPOFOL (N/A )  Patient Location: Endoscopy Unit  Anesthesia Type:General  Level of Consciousness: drowsy  Airway & Oxygen Therapy: Patient Spontanous Breathing  Post-op Assessment: Report given to RN and Post -op Vital signs reviewed and stable  Post vital signs: Reviewed and stable  Last Vitals:  Vitals Value Taken Time  BP 98/71 09/08/20 1100  Temp    Pulse 77 09/08/20 1100  Resp 19 09/08/20 1100  SpO2 93 % 09/08/20 1100  Vitals shown include unvalidated device data.  Last Pain:  Vitals:   09/08/20 1014  TempSrc: Temporal  PainSc: 0-No pain         Complications: No complications documented.

## 2020-09-08 NOTE — Op Note (Signed)
[facility] Gastroenterology Patient Name: Heberto Sturdevant Procedure Date: 09/08/2020 10:21 AM MRN: [id number] Account #: [id number] Date of Birth: February 18, 1956 Admit Type: Outpatient Age: 65 Room: [facility] ENDO ROOM 2 Gender: Male Note Status: Finalized Procedure:             Colonoscopy Indications:           High risk colon cancer surveillance: Personal history                         of colonic polyps Providers:             Rachael Ferrie B. Bonna Gains MD, MD Referring MD:          Vickki Muff. Chrismon, MD (Referring MD) Medicines:             Monitored Anesthesia Care Complications:         No immediate complications. Procedure:             Pre-Anesthesia Assessment:                        - ASA Grade Assessment: II - A patient with mild                         systemic disease.                        - Prior to the procedure, a History and Physical was                         performed, and patient medications, allergies and                         sensitivities were reviewed. The patient's tolerance                         of previous anesthesia was reviewed.                        - The risks and benefits of the procedure and the                         sedation options and risks were discussed with the                         patient. All questions were answered and informed                         consent was obtained.                        - Patient identification and proposed procedure were                         verified prior to the procedure by the physician, the                         nurse, the anesthesiologist, the anesthetist and the                         technician. The procedure  was verified in the                         procedure room.                        After obtaining informed consent, the colonoscope was                         passed under direct vision. Throughout the procedure,                         the patient's blood pressure, pulse, and  oxygen                         saturations were monitored continuously. The                         Colonoscope was introduced through the anus and                         advanced to the the cecum, identified by appendiceal                         orifice and ileocecal valve. The colonoscopy was                         performed with ease. The patient tolerated the                         procedure well. The quality of the bowel preparation                         was good except the ascending colon was fair. Colon                         was cleaned as much as possible with water and suction. Findings:      The perianal and digital rectal examinations were normal.      Two flat polyps were found in the recto-sigmoid colon. The polyps were 4       to 5 mm in size. These polyps were removed with a cold snare. Resection       and retrieval were complete.      Multiple diverticula were found in the sigmoid colon.      The exam was otherwise without abnormality.      The rectum, sigmoid colon, descending colon, transverse colon, ascending       colon and cecum appeared normal.      Non-bleeding internal hemorrhoids were found during retroflexion.      No additional abnormalities were found on retroflexion. Impression:            - Two 4 to 5 mm polyps at the recto-sigmoid colon,                         removed with a cold snare. Resected and retrieved.                        - Diverticulosis in the sigmoid colon.                        -  The examination was otherwise normal.                        - The rectum, sigmoid colon, descending colon,                         transverse colon, ascending colon and cecum are normal.                        - Non-bleeding internal hemorrhoids. Recommendation:        - Discharge patient to home (with escort).                        - High fiber diet.                        - Advance diet as tolerated.                        - Continue present  medications.                        - Await pathology results.                        - Repeat colonoscopy in 3 years, with 2 day prep due                         to fair prep in the right colon.                        - The findings and recommendations were discussed with                         the patient.                        - The findings and recommendations were discussed with                         the patient's family.                        - Return to primary care physician as previously                         scheduled. Procedure Code(s):     --- Professional ---                        [phone], Colonoscopy, flexible; with removal of                         tumor(s), polyp(s), or other lesion(s) by snare                         technique Diagnosis Code(s):     --- Professional ---                        Z86.010, Personal history of colonic polyps  K63.5, Polyp of colon CPT copyright 2019 American Medical Association. All rights reserved. The codes documented in this report are preliminary and upon coder review may  be revised to meet current compliance requirements.  Vonda Antigua, MD Margretta Sidle B. Bonna Gains MD, MD 09/08/2020 11:05:36 AM This report has been signed electronically. Number of Addenda: 0 Note Initiated On: 09/08/2020 10:21 AM Scope Withdrawal Time: 0 hours 17 minutes 30 seconds  Total Procedure Duration: 0 hours 21 minutes 30 seconds  Estimated Blood Loss:  Estimated blood loss: none.      [facility]

## 2020-09-08 NOTE — Anesthesia Preprocedure Evaluation (Signed)
Anesthesia Evaluation  Patient identified by MRN, date of birth, ID band  Airway Mallampati: II  TM Distance: >3 FB     Dental  (+) Teeth Intact   Pulmonary  Seasonal allergies   Pulmonary exam normal        Cardiovascular negative cardio ROS Normal cardiovascular exam     Neuro/Psych negative neurological ROS  negative psych ROS   GI/Hepatic negative GI ROS, Neg liver ROS,   Endo/Other  negative endocrine ROS  Renal/GU negative Renal ROS  negative genitourinary   Musculoskeletal negative musculoskeletal ROS (+)   Abdominal Normal abdominal exam  (+)   Peds negative pediatric ROS (+)  Hematology negative hematology ROS (+)   Anesthesia Other Findings Past Medical History: No date: H/O seasonal allergies No date: Hyperlipidemia  Reproductive/Obstetrics                             Anesthesia Physical Anesthesia Plan  ASA: II  Anesthesia Plan: General   Post-op Pain Management:    Induction: Intravenous  PONV Risk Score and Plan: Propofol infusion  Airway Management Planned: Nasal Cannula  Additional Equipment:   Intra-op Plan:   Post-operative Plan:   Informed Consent: I have reviewed the patients History and Physical, chart, labs and discussed the procedure including the risks, benefits and alternatives for the proposed anesthesia with the patient or authorized representative who has indicated his/her understanding and acceptance.     Dental advisory given  Plan Discussed with: CRNA and Surgeon  Anesthesia Plan Comments:         Anesthesia Quick Evaluation

## 2020-09-10 NOTE — Anesthesia Postprocedure Evaluation (Signed)
Anesthesia Post Note  Patient: Jorge Knox  Procedure(s) Performed: COLONOSCOPY WITH PROPOFOL (N/A )  Patient location during evaluation: Endoscopy Anesthesia Type: General Level of consciousness: awake and alert and oriented Pain management: pain level controlled Vital Signs Assessment: post-procedure vital signs reviewed and stable Respiratory status: spontaneous breathing Cardiovascular status: blood pressure returned to baseline Anesthetic complications: no   No complications documented.   Last Vitals:  Vitals:   09/08/20 1120 09/08/20 1130  BP: 124/77 131/86  Pulse: 62 (!) 55  Resp: 20 17  Temp:    SpO2: 97% 98%    Last Pain:  Vitals:   09/09/20 1145  TempSrc:   PainSc: 0-No pain                 CARROLL,PAUL

## 2020-09-11 ENCOUNTER — Encounter: Payer: Self-pay | Admitting: Gastroenterology

## 2020-09-11 LAB — SURGICAL PATHOLOGY

## 2020-09-19 ENCOUNTER — Encounter: Payer: Self-pay | Admitting: Gastroenterology

## 2020-10-16 ENCOUNTER — Ambulatory Visit: Payer: PRIVATE HEALTH INSURANCE | Admitting: Family Medicine

## 2020-10-17 ENCOUNTER — Ambulatory Visit (INDEPENDENT_AMBULATORY_CARE_PROVIDER_SITE_OTHER): Payer: PRIVATE HEALTH INSURANCE | Admitting: Physician Assistant

## 2020-10-17 ENCOUNTER — Other Ambulatory Visit: Payer: Self-pay

## 2020-10-17 ENCOUNTER — Encounter: Payer: Self-pay | Admitting: Physician Assistant

## 2020-10-17 VITALS — BP 117/78 | HR 59 | Temp 98.5°F | Wt 246.0 lb

## 2020-10-17 DIAGNOSIS — E6609 Other obesity due to excess calories: Secondary | ICD-10-CM

## 2020-10-17 DIAGNOSIS — E7800 Pure hypercholesterolemia, unspecified: Secondary | ICD-10-CM

## 2020-10-17 DIAGNOSIS — Z6834 Body mass index (BMI) 34.0-34.9, adult: Secondary | ICD-10-CM

## 2020-10-17 NOTE — Progress Notes (Signed)
Established patient visit   Patient: Jorge Knox   DOB: 1955-09-07   65 y.o. Male  MRN: [id number] Visit Date: 10/17/2020  Today's healthcare provider: Mar Daring, PA-C   Chief Complaint  Patient presents with  . Hyperlipidemia   Subjective    HPI  Lipid/Cholesterol, Follow-up  Last lipid panel Other pertinent labs  Lab Results  Component Value Date   CHOL 170 05/08/2020   HDL 32 (L) 05/08/2020   LDLCALC 124 (H) 05/08/2020   TRIG 76 05/08/2020   CHOLHDL 5.3 (H) 05/08/2020   Lab Results  Component Value Date   ALT 42 05/08/2020   AST 21 05/08/2020   PLT 234 05/08/2020   TSH 3.220 05/08/2020     He was last seen for this 5 months ago.  Management since that visit includes starting atrovastatin 20mg  daily.  He reports excellent compliance with treatment. He is not having side effects.   Symptoms: No chest pain No chest pressure/discomfort  No dyspnea No lower extremity edema  No numbness or tingling of extremity No orthopnea  No palpitations No paroxysmal nocturnal dyspnea  No speech difficulty No syncope   Current diet: in general, a "healthy" diet   Current exercise: Pt reports he exercises daily.  The 10-year ASCVD risk score Mikey Bussing DC Brooke Bonito., et al., 2013) is: 11%  ---------------------------------------------------------------------------------------------------   Patient Active Problem List   Diagnosis Date Noted  . Encounter for screening colonoscopy   . Polyp of colon   . 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  . BCC (basal cell carcinoma of skin) 09/04/2018  . Hyperlipidemia 09/18/2015  . Allergic rhinitis 09/18/2015   Past Medical History:  Diagnosis Date  . H/O seasonal allergies   . Hyperlipidemia    Social History   Tobacco Use  . Smoking status: Never Smoker  . Smokeless tobacco: Never Used  Substance Use Topics  . Alcohol use: Not Currently    Alcohol/week: 0.0 standard drinks  . Drug use: Never   Allergies   Allergen Reactions  . Cefdinir Nausea Only     Medications: Outpatient Medications Prior to Visit  Medication Sig  . atorvastatin (LIPITOR) 20 MG tablet Take 1 tablet (20 mg total) by mouth daily.  . Omega-3 Fatty Acids (FISH OIL BURP-LESS PO) Take by mouth daily.   No facility-administered medications prior to visit.    Review of Systems  Constitutional: Negative.   Respiratory: Negative.   Cardiovascular: Negative.   Gastrointestinal: Negative.   Neurological: Negative for dizziness, light-headedness and headaches.        Objective    BP 117/78 (BP Location: Left Arm, Patient Position: Sitting, Cuff Size: Large)   Pulse (!) 59   Temp 98.5 F (36.9 C) (Oral)   Wt 246 lb (111.6 kg)   BMI 34.31 kg/m    Physical Exam Vitals reviewed.  Constitutional:      General: He is not in acute distress.    Appearance: Normal appearance. He is well-developed. He is obese. He is not ill-appearing.  HENT:     Head: Normocephalic and atraumatic.  Eyes:     Conjunctiva/sclera: Conjunctivae normal.  Pulmonary:     Effort: Pulmonary effort is normal. No respiratory distress.  Musculoskeletal:     Cervical back: Normal range of motion and neck supple.  Neurological:     Mental Status: He is alert.  Psychiatric:        Mood and Affect: Mood normal.  Behavior: Behavior normal.        Thought Content: Thought content normal.        Judgment: Judgment normal.       No results found for any visits on 10/17/20.  Assessment & Plan     1. Hypercholesterolemia Patient did not need an OV just needed labs rechecked after starting a statin medication. Will check labs as below and f/u pending results. - Lipid Panel With LDL/HDL Ratio - Comprehensive Metabolic Panel (CMET)  2. Class 1 obesity due to excess calories with serious comorbidity and body mass index (BMI) of 34.0 to 34.9 in adult Patient is doing well. Just started a diet 3 weeks ago. Down about 5-6 pounds.  -  Lipid Panel With LDL/HDL Ratio - Comprehensive Metabolic Panel (CMET)   No follow-ups on file.      Reynolds Bowl, PA-C, have reviewed all documentation for this visit. The documentation on 10/17/20 for the exam, diagnosis, procedures, and orders are all accurate and complete.   Rubye Beach  [facility] [phone] (phone) [phone] (fax)  [place]

## 2020-10-19 LAB — COMPREHENSIVE METABOLIC PANEL
ALT: 41 IU/L (ref 0–44)
AST: 24 IU/L (ref 0–40)
Albumin/Globulin Ratio: 2 (ref 1.2–2.2)
Albumin: 4.6 g/dL (ref 3.8–4.8)
Alkaline Phosphatase: 69 IU/L (ref 44–121)
BUN/Creatinine Ratio: 17 (ref 10–24)
BUN: 18 mg/dL (ref 8–27)
Bilirubin Total: 1.1 mg/dL (ref 0.0–1.2)
CO2: 20 mmol/L (ref 20–29)
Calcium: 9.2 mg/dL (ref 8.6–10.2)
Chloride: 103 mmol/L (ref 96–106)
Creatinine, Ser: 1.06 mg/dL (ref 0.76–1.27)
Globulin, Total: 2.3 g/dL (ref 1.5–4.5)
Glucose: 91 mg/dL (ref 65–99)
Potassium: 4.8 mmol/L (ref 3.5–5.2)
Sodium: 138 mmol/L (ref 134–144)
Total Protein: 6.9 g/dL (ref 6.0–8.5)
eGFR: 79 mL/min/{1.73_m2} (ref >59)

## 2020-10-19 LAB — LIPID PANEL WITH LDL/HDL RATIO
Cholesterol, Total: 102 mg/dL (ref 100–199)
HDL: 33 mg/dL — ABNORMAL LOW (ref >39)
LDL Chol Calc (NIH): 57 mg/dL (ref 0–99)
LDL/HDL Ratio: 1.7 ratio (ref 0.0–3.6)
Triglycerides: 47 mg/dL (ref 0–149)
VLDL Cholesterol Cal: 12 mg/dL (ref 5–40)

## 2021-01-11 ENCOUNTER — Telehealth: Payer: Self-pay | Admitting: Podiatry

## 2021-01-11 ENCOUNTER — Ambulatory Visit (INDEPENDENT_AMBULATORY_CARE_PROVIDER_SITE_OTHER): Payer: No Typology Code available for payment source

## 2021-01-11 ENCOUNTER — Ambulatory Visit (INDEPENDENT_AMBULATORY_CARE_PROVIDER_SITE_OTHER): Payer: No Typology Code available for payment source | Admitting: Podiatry

## 2021-01-11 ENCOUNTER — Other Ambulatory Visit: Payer: Self-pay

## 2021-01-11 DIAGNOSIS — M722 Plantar fascial fibromatosis: Secondary | ICD-10-CM

## 2021-01-11 NOTE — Telephone Encounter (Signed)
Patient called wondering if it was normal for his heel to be hurting after the injection. I called him back and spoke with him letting him know that it was normal and that he will not see immediate results. I told him to give [place] a call back if he had anymore concerns.

## 2021-01-16 ENCOUNTER — Encounter: Payer: Self-pay | Admitting: Podiatry

## 2021-01-16 NOTE — Progress Notes (Signed)
  Subjective:  Patient ID: Jorge Knox, male    DOB: [birth year],  MRN: [id number]  Chief Complaint  Patient presents with   Foot Pain    R HEEL PAIN, PT WOULD LIKE INJ    65 y.o. male presents with the above complaint.  Patient presents with complaint of right Planter fasciitis.  Patient has been going on for about a months.  It hurts all the time hurts with ambulation.  There is throbbing pain.  Pain scale is 10 out of 10 especially when he is about to go to sleep.  Morning pain nothing helps.  He has not seen anyone else prior to see me.  He would like to discuss treatment options.  He is open to getting injection.  He denies any orthotics.  He denies any other acute complaints   Review of Systems: Negative except as noted in the HPI. Denies N/V/F/Ch.  Past Medical History:  Diagnosis Date   H/O seasonal allergies    Hyperlipidemia     Current Outpatient Medications:    atorvastatin (LIPITOR) 20 MG tablet, Take 1 tablet (20 mg total) by mouth daily., Disp: 90 tablet, Rfl: 3   Omega-3 Fatty Acids (FISH OIL BURP-LESS PO), Take by mouth daily., Disp: , Rfl:   Social History   Tobacco Use  Smoking Status Never  Smokeless Tobacco Never    Allergies  Allergen Reactions   Cefdinir Nausea Only   Objective:  There were no vitals filed for this visit. There is no height or weight on file to calculate BMI. Constitutional Well developed. Well nourished.  Vascular Dorsalis pedis pulses palpable bilaterally. Posterior tibial pulses palpable bilaterally. Capillary refill normal to all digits.  No cyanosis or clubbing noted. Pedal hair growth normal.  Neurologic Normal speech. Oriented to person, place, and time. Epicritic sensation to light touch grossly present bilaterally.  Dermatologic Nails well groomed and normal in appearance. No open wounds. No skin lesions.  Orthopedic: Normal joint ROM without pain or crepitus bilaterally. No visible deformities. Tender to  palpation at the calcaneal tuber right. No pain with calcaneal squeeze right. Ankle ROM diminished range of motion right. Silfverskiold Test: positive right.   Radiographs: Taken and reviewed. No acute fractures or dislocations. No evidence of stress fracture.  Plantar heel spur absent. Posterior heel spur present.   Assessment:   1. Plantar fasciitis of right foot    Plan:  Patient was evaluated and treated and all questions answered.  Plantar Fasciitis, right - XR reviewed as above.  - Educated on icing and stretching. Instructions given.  - Injection delivered to the plantar fascia as below. - DME: Plantar Fascial Brace - Pharmacologic management: None  Procedure: Injection Tendon/Ligament Location: Right plantar fascia at the glabrous junction; medial approach. Skin Prep: alcohol Injectate: 0.5 cc 0.5% marcaine plain, 0.5 cc of 1% Lidocaine, 0.5 cc kenalog 10. Disposition: Patient tolerated procedure well. Injection site dressed with a band-aid.  No follow-ups on file.

## 2021-02-08 ENCOUNTER — Other Ambulatory Visit: Payer: Self-pay

## 2021-02-08 ENCOUNTER — Ambulatory Visit (INDEPENDENT_AMBULATORY_CARE_PROVIDER_SITE_OTHER): Payer: No Typology Code available for payment source | Admitting: Podiatry

## 2021-02-08 DIAGNOSIS — M722 Plantar fascial fibromatosis: Secondary | ICD-10-CM

## 2021-02-09 ENCOUNTER — Encounter: Payer: Self-pay | Admitting: Podiatry

## 2021-02-09 NOTE — Progress Notes (Signed)
  Subjective:  Patient ID: Jorge Knox, male    DOB: 05-Nov-1955,  MRN: [id number]  Chief Complaint  Patient presents with   Plantar Fasciitis    Right foot pain  PT stated that the injection did help some     65 y.o. male presents with the above complaint.  Patient presents with follow-up for right Planter fasciitis.  Patient states the injection does a lot better.  It helped him considerably.  The brace has not been helping whatsoever.  He states that he still has some residual pain he denies any other acute complaints.  He has made shoe gear modification.   Review of Systems: Negative except as noted in the HPI. Denies N/V/F/Ch.  Past Medical History:  Diagnosis Date   H/O seasonal allergies    Hyperlipidemia     Current Outpatient Medications:    atorvastatin (LIPITOR) 20 MG tablet, Take 1 tablet (20 mg total) by mouth daily., Disp: 90 tablet, Rfl: 3   Omega-3 Fatty Acids (FISH OIL BURP-LESS PO), Take by mouth daily., Disp: , Rfl:   Social History   Tobacco Use  Smoking Status Never  Smokeless Tobacco Never    Allergies  Allergen Reactions   Cefdinir Nausea Only   Objective:  There were no vitals filed for this visit. There is no height or weight on file to calculate BMI. Constitutional Well developed. Well nourished.  Vascular Dorsalis pedis pulses palpable bilaterally. Posterior tibial pulses palpable bilaterally. Capillary refill normal to all digits.  No cyanosis or clubbing noted. Pedal hair growth normal.  Neurologic Normal speech. Oriented to person, place, and time. Epicritic sensation to light touch grossly present bilaterally.  Dermatologic Nails well groomed and normal in appearance. No open wounds. No skin lesions.  Orthopedic: Normal joint ROM without pain or crepitus bilaterally. No visible deformities. Tender to palpation at the calcaneal tuber right. No pain with calcaneal squeeze right. Ankle ROM diminished range of motion  right. Silfverskiold Test: positive right.   Radiographs: Taken and reviewed. No acute fractures or dislocations. No evidence of stress fracture.  Plantar heel spur absent. Posterior heel spur present.   Assessment:   1. Plantar fasciitis of right foot     Plan:  Patient was evaluated and treated and all questions answered.  Plantar Fasciitis, right - XR reviewed as above.  - Educated on icing and stretching. Instructions given.  -Second injection delivered to the plantar fascia as below. - DME: Plantar Fascial Brace - Pharmacologic management: None  Procedure: Injection Tendon/Ligament Location: Right plantar fascia at the glabrous junction; medial approach. Skin Prep: alcohol Injectate: 0.5 cc 0.5% marcaine plain, 0.5 cc of 1% Lidocaine, 0.5 cc kenalog 10. Disposition: Patient tolerated procedure well. Injection site dressed with a band-aid.  No follow-ups on file.

## 2021-03-13 ENCOUNTER — Ambulatory Visit (INDEPENDENT_AMBULATORY_CARE_PROVIDER_SITE_OTHER): Payer: No Typology Code available for payment source | Admitting: Podiatry

## 2021-03-13 ENCOUNTER — Other Ambulatory Visit: Payer: Self-pay

## 2021-03-13 ENCOUNTER — Encounter: Payer: Self-pay | Admitting: Podiatry

## 2021-03-13 DIAGNOSIS — M722 Plantar fascial fibromatosis: Secondary | ICD-10-CM | POA: Diagnosis not present

## 2021-03-13 NOTE — Progress Notes (Signed)
  Subjective:  Patient ID: Jorge Knox, male    DOB: 08-09-1955,  MRN: [id number]  Chief Complaint  Patient presents with   Plantar Fasciitis    PT stated that he is doing a lot better he stated that he does still have some pain but its no where near as bad as it was     65 y.o. male presents with the above complaint.  Patient presents with follow-up for right Planter fasciitis.  Patient states the injection does a lot better.  It helped him considerably.  He states the injection helps.  He is developing residual pain.  He denies any other acute complaints.  He is working on Psychologist, counselling modification and now with insoles   Review of Systems: Negative except as noted in the HPI. Denies N/V/F/Ch.  Past Medical History:  Diagnosis Date   H/O seasonal allergies    Hyperlipidemia     Current Outpatient Medications:    atorvastatin (LIPITOR) 20 MG tablet, Take 1 tablet (20 mg total) by mouth daily., Disp: 90 tablet, Rfl: 3   Omega-3 Fatty Acids (FISH OIL BURP-LESS PO), Take by mouth daily., Disp: , Rfl:   Social History   Tobacco Use  Smoking Status Never  Smokeless Tobacco Never    Allergies  Allergen Reactions   Cefdinir Nausea Only   Objective:  There were no vitals filed for this visit. There is no height or weight on file to calculate BMI. Constitutional Well developed. Well nourished.  Vascular Dorsalis pedis pulses palpable bilaterally. Posterior tibial pulses palpable bilaterally. Capillary refill normal to all digits.  No cyanosis or clubbing noted. Pedal hair growth normal.  Neurologic Normal speech. Oriented to person, place, and time. Epicritic sensation to light touch grossly present bilaterally.  Dermatologic Nails well groomed and normal in appearance. No open wounds. No skin lesions.  Orthopedic: Normal joint ROM without pain or crepitus bilaterally. No visible deformities. Tender to palpation at the calcaneal tuber right. No pain with calcaneal  squeeze right. Ankle ROM diminished range of motion right. Silfverskiold Test: positive right.   Radiographs: Taken and reviewed. No acute fractures or dislocations. No evidence of stress fracture.  Plantar heel spur absent. Posterior heel spur present.   Assessment:   1. Plantar fasciitis of right foot      Plan:  Patient was evaluated and treated and all questions answered.  Plantar Fasciitis, right - XR reviewed as above.  - Educated on icing and stretching. Instructions given.  -Third injection delivered to the plantar fascia as below. - DME: Plantar Fascial Brace - Pharmacologic management: None -I encouraged him to obtain power steps over-the-counter orthotics.  Patient states understanding will do so immediately  Procedure: Injection Tendon/Ligament Location: Right plantar fascia at the glabrous junction; medial approach. Skin Prep: alcohol Injectate: 0.5 cc 0.5% marcaine plain, 0.5 cc of 1% Lidocaine, 0.5 cc kenalog 10. Disposition: Patient tolerated procedure well. Injection site dressed with a band-aid.  No follow-ups on file.

## 2021-06-04 ENCOUNTER — Other Ambulatory Visit: Payer: Self-pay

## 2021-06-04 ENCOUNTER — Telehealth: Payer: Self-pay | Admitting: Family Medicine

## 2021-06-04 MED ORDER — ATORVASTATIN CALCIUM 20 MG PO TABS: 20.0000 mg | ORAL_TABLET | Freq: Every day | ORAL | 3 refills | Status: DC

## 2021-06-04 NOTE — Telephone Encounter (Signed)
[facility] faxed refill request for the following medications:   atorvastatin (LIPITOR) 20 MG tablet   Please advise.

## 2021-12-19 ENCOUNTER — Ambulatory Visit (INDEPENDENT_AMBULATORY_CARE_PROVIDER_SITE_OTHER): Payer: PRIVATE HEALTH INSURANCE | Admitting: Family Medicine

## 2021-12-19 ENCOUNTER — Encounter: Payer: Self-pay | Admitting: Family Medicine

## 2021-12-19 VITALS — BP 130/72 | HR 69 | Resp 16 | Ht 70.0 in | Wt 252.0 lb

## 2021-12-19 DIAGNOSIS — Z125 Encounter for screening for malignant neoplasm of prostate: Secondary | ICD-10-CM

## 2021-12-19 DIAGNOSIS — E782 Mixed hyperlipidemia: Secondary | ICD-10-CM

## 2021-12-19 DIAGNOSIS — Z0000 Encounter for general adult medical examination without abnormal findings: Secondary | ICD-10-CM

## 2021-12-19 NOTE — Progress Notes (Signed)
Orders encounter only

## 2021-12-28 LAB — COMPREHENSIVE METABOLIC PANEL
ALT: 29 IU/L (ref 0–44)
AST: 17 IU/L (ref 0–40)
Albumin/Globulin Ratio: 1.8 (ref 1.2–2.2)
Albumin: 4.2 g/dL (ref 3.8–4.8)
Alkaline Phosphatase: 77 IU/L (ref 44–121)
BUN/Creatinine Ratio: 10 (ref 10–24)
BUN: 11 mg/dL (ref 8–27)
Bilirubin Total: 0.9 mg/dL (ref 0.0–1.2)
CO2: 23 mmol/L (ref 20–29)
Calcium: 9.7 mg/dL (ref 8.6–10.2)
Chloride: 105 mmol/L (ref 96–106)
Creatinine, Ser: 1.08 mg/dL (ref 0.76–1.27)
Globulin, Total: 2.3 g/dL (ref 1.5–4.5)
Glucose: 109 mg/dL — ABNORMAL HIGH (ref 70–99)
Potassium: 5.2 mmol/L (ref 3.5–5.2)
Sodium: 141 mmol/L (ref 134–144)
Total Protein: 6.5 g/dL (ref 6.0–8.5)
eGFR: 77 mL/min/{1.73_m2} (ref >59)

## 2021-12-28 LAB — LIPID PANEL WITH LDL/HDL RATIO
Cholesterol, Total: 107 mg/dL (ref 100–199)
HDL: 33 mg/dL — ABNORMAL LOW (ref >39)
LDL Chol Calc (NIH): 61 mg/dL (ref 0–99)
LDL/HDL Ratio: 1.8 ratio (ref 0.0–3.6)
Triglycerides: 56 mg/dL (ref 0–149)
VLDL Cholesterol Cal: 13 mg/dL (ref 5–40)

## 2022-02-20 NOTE — Progress Notes (Unsigned)
Complete physical exam   Patient: Jorge Knox   DOB: 17-Aug-1955   66 y.o. Male  MRN: [id number] Visit Date: 02/21/2022  Today's healthcare provider: Gwyneth Sprout, FNP  Patient presents for new patient visit to establish care.  Introduced to Designer, jewellery role and practice setting.  All questions answered.  Discussed provider/patient relationship and expectations.   I,Hillarie Harrigan J Erykah Lippert,acting as a scribe for Gwyneth Sprout, FNP.,have documented all relevant documentation on the behalf of Gwyneth Sprout, FNP,as directed by  Gwyneth Sprout, FNP while in the presence of Gwyneth Sprout, FNP.   Chief Complaint  Patient presents with   Annual Exam   Subjective    Jorge Knox is a 65 y.o. male who presents today for a complete physical exam.  He reports consuming a  keto  diet. The patient has a physically strenuous job, but has no regular exercise apart from work.  He generally feels well. He reports sleeping well. He does not have additional problems to discuss today.  HPI    Past Medical History:  Diagnosis Date   H/O seasonal allergies    Hyperlipidemia    Past Surgical History:  Procedure Laterality Date   COLONOSCOPY WITH PROPOFOL N/A 09/08/2020   Procedure: COLONOSCOPY WITH PROPOFOL;  Surgeon: Virgel Manifold, MD;  Location: [facility] ENDOSCOPY;  Service: Endoscopy;  Laterality: N/A;  COVID POSITIVE 08/09/2020   WRIST SURGERY Right    pinning   Social History   Socioeconomic History   Marital status: Married    Spouse name: Not on file   Number of children: Not on file   Years of education: Not on file   Highest education level: Not on file  Occupational History   Not on file  Tobacco Use   Smoking status: Never   Smokeless tobacco: Never  Substance and Sexual Activity   Alcohol use: Not Currently    Alcohol/week: 0.0 standard drinks of alcohol   Drug use: Never   Sexual activity: Not on file  Other Topics Concern   Not on file  Social History  Narrative   Not on file   Social Determinants of Health   Financial Resource Strain: Not on file  Food Insecurity: Not on file  Transportation Needs: Not on file  Physical Activity: Not on file  Stress: Not on file  Social Connections: Not on file  Intimate Partner Violence: Not on file   No family status information on file.   History reviewed. No pertinent family history. Allergies  Allergen Reactions   Cefdinir Nausea Only    Patient Care Team: Gwyneth Sprout, FNP as PCP - General (Family Medicine)   Medications: Outpatient Medications Prior to Visit  Medication Sig   atorvastatin (LIPITOR) 20 MG tablet Take 1 tablet (20 mg total) by mouth daily.   [DISCONTINUED] cyclobenzaprine (FLEXERIL) 10 MG tablet Take 10 mg by mouth 3 (three) times daily as needed.   [DISCONTINUED] diclofenac (VOLTAREN) 75 MG EC tablet Take 75 mg by mouth 2 (two) times daily.   No facility-administered medications prior to visit.    Review of Systems  Last CBC Lab Results  Component Value Date   WBC 5.8 05/08/2020   HGB 17.1 05/08/2020   HCT 49.4 05/08/2020   MCV 93 05/08/2020   MCH 32.1 05/08/2020   RDW 12.0 05/08/2020   PLT 234 88/05/314   Last metabolic panel Lab Results  Component Value Date   GLUCOSE 109 (H) 12/27/2021  NA 141 12/27/2021   K 5.2 12/27/2021   CL 105 12/27/2021   CO2 23 12/27/2021   BUN 11 12/27/2021   CREATININE 1.08 12/27/2021   EGFR 77 12/27/2021   CALCIUM 9.7 12/27/2021   PROT 6.5 12/27/2021   ALBUMIN 4.2 12/27/2021   LABGLOB 2.3 12/27/2021   AGRATIO 1.8 12/27/2021   BILITOT 0.9 12/27/2021   ALKPHOS 77 12/27/2021   AST 17 12/27/2021   ALT 29 12/27/2021   Last lipids Lab Results  Component Value Date   CHOL 107 12/27/2021   HDL 33 (L) 12/27/2021   LDLCALC 61 12/27/2021   TRIG 56 12/27/2021   CHOLHDL 5.3 (H) 05/08/2020    Last thyroid functions Lab Results  Component Value Date   TSH 3.220 05/08/2020    Objective     BP 130/82 (BP  Location: Right Arm, Patient Position: Sitting, Cuff Size: Large)   Pulse 60   Temp 98 F (36.7 C) (Oral)   Resp 16   Ht '5\' 11"'  (1.803 m)   Wt 249 lb (112.9 kg)   SpO2 98%   BMI 34.73 kg/m   BP Readings from Last 3 Encounters:  02/21/22 130/82  12/19/21 130/72  10/17/20 117/78   Wt Readings from Last 3 Encounters:  02/21/22 249 lb (112.9 kg)  12/19/21 252 lb (114.3 kg)  10/17/20 246 lb (111.6 kg)   SpO2 Readings from Last 3 Encounters:  02/21/22 98%  12/19/21 96%  09/08/20 98%   Physical Exam Vitals and nursing note reviewed.  Constitutional:      General: He is awake. He is not in acute distress.    Appearance: Normal appearance. He is well-developed and well-groomed. He is obese. He is not ill-appearing, toxic-appearing or diaphoretic.  HENT:     Head: Normocephalic and atraumatic.     Jaw: There is normal jaw occlusion. No trismus, tenderness, swelling or pain on movement.     Salivary Glands: Right salivary gland is not diffusely enlarged or tender. Left salivary gland is not diffusely enlarged or tender.     Right Ear: Hearing, tympanic membrane, ear canal and external ear normal. There is no impacted cerumen.     Left Ear: Hearing, tympanic membrane, ear canal and external ear normal. There is no impacted cerumen.     Nose: Nose normal. No congestion or rhinorrhea.     Right Turbinates: Not enlarged, swollen or pale.     Left Turbinates: Not enlarged, swollen or pale.     Right Sinus: No maxillary sinus tenderness or frontal sinus tenderness.     Left Sinus: No maxillary sinus tenderness or frontal sinus tenderness.     Mouth/Throat:     Lips: Pink.     Mouth: Mucous membranes are moist. No injury, lacerations, oral lesions or angioedema.     Pharynx: Oropharynx is clear. Uvula midline. No pharyngeal swelling, oropharyngeal exudate or posterior oropharyngeal erythema.     Tonsils: No tonsillar exudate or tonsillar abscesses.  Eyes:     General: Lids are normal.  Vision grossly intact. Gaze aligned appropriately.        Right eye: No discharge.        Left eye: No discharge.     Extraocular Movements: Extraocular movements intact.     Conjunctiva/sclera: Conjunctivae normal.     Pupils: Pupils are equal, round, and reactive to light.  Neck:     Thyroid: No thyroid mass, thyromegaly or thyroid tenderness.     Vascular: No carotid bruit.  Trachea: Trachea normal. No tracheal tenderness.  Cardiovascular:     Rate and Rhythm: Normal rate and regular rhythm.     Pulses: Normal pulses.          Carotid pulses are 2+ on the right side and 2+ on the left side.      Radial pulses are 2+ on the right side and 2+ on the left side.       Femoral pulses are 2+ on the right side and 2+ on the left side.      Popliteal pulses are 2+ on the right side and 2+ on the left side.       Dorsalis pedis pulses are 2+ on the right side and 2+ on the left side.       Posterior tibial pulses are 2+ on the right side and 2+ on the left side.     Heart sounds: Normal heart sounds, S1 normal and S2 normal. No murmur heard.    No friction rub. No gallop.  Pulmonary:     Effort: Pulmonary effort is normal. No respiratory distress.     Breath sounds: Normal breath sounds and air entry. No stridor. No wheezing, rhonchi or rales.  Chest:     Chest wall: No tenderness.  Abdominal:     General: Abdomen is flat. Bowel sounds are normal. There is no distension.     Palpations: Abdomen is soft. There is no mass.     Tenderness: There is no abdominal tenderness. There is no guarding or rebound.     Hernia: A hernia is present. Hernia is present in the ventral area.       Comments: 14 x 9 x 3 cm ventral hernia   Genitourinary:    Comments: Exam deferred; denies complaints Musculoskeletal:        General: No swelling, tenderness, deformity or signs of injury. Normal range of motion.     Cervical back: Normal range of motion and neck supple. No rigidity or tenderness.      Right lower leg: No edema.     Left lower leg: No edema.  Lymphadenopathy:     Cervical: No cervical adenopathy.     Right cervical: No superficial, deep or posterior cervical adenopathy.    Left cervical: No superficial, deep or posterior cervical adenopathy.  Skin:    General: Skin is warm and dry.     Capillary Refill: Capillary refill takes less than 2 seconds.     Coloration: Skin is not jaundiced or pale.     Findings: No bruising, erythema, lesion or rash.  Neurological:     General: No focal deficit present.     Mental Status: He is alert and oriented to person, place, and time. Mental status is at baseline.     GCS: GCS eye subscore is 4. GCS verbal subscore is 5. GCS motor subscore is 6.     Sensory: Sensation is intact. No sensory deficit.     Motor: Motor function is intact. No weakness.     Coordination: Coordination is intact.     Gait: Gait is intact.  Psychiatric:        Attention and Perception: Attention and perception normal.        Mood and Affect: Mood and affect normal.        Speech: Speech normal.        Behavior: Behavior normal. Behavior is cooperative.        Thought Content: Thought content normal.  Cognition and Memory: Cognition normal.        Judgment: Judgment normal.     Last depression screening scores    10/17/2020    3:08 PM 05/04/2020    3:35 PM 09/04/2018    2:31 PM  PHQ 2/9 Scores  PHQ - 2 Score 0 0 0  PHQ- 9 Score 0  0   Last fall risk screening    10/17/2020    3:08 PM  [place]us in the past year? 0  Number falls in past yr: 0  Injury with Fall? 0  Follow up Falls evaluation completed   Last Audit-C alcohol use screening    10/17/2020    3:09 PM  Alcohol Use Disorder Test (AUDIT)  1. How often do you have a drink containing alcohol? 0  2. How many drinks containing alcohol do you have on a typical day when you are drinking? 0  3. How often do you have six or more drinks on one occasion? 0  AUDIT-C Score 0   A  score of 3 or more in women, and 4 or more in men indicates increased risk for alcohol abuse, EXCEPT if all of the points are from question 1   No results found for any visits on 02/21/22.  Assessment & Plan    Routine Health Maintenance and Physical Exam  Exercise Activities and Dietary recommendations  Goals   None     Immunization History  Administered Date(s) Administered   Influenza Inj Mdck Quad With Preservative 06/08/2018   Influenza,inj,Quad PF,6+ Mos 05/04/2015, 05/04/2020   Tdap 04/19/2011    Health Maintenance  Topic Date Due   COVID-19 Vaccine (1) Never done   Zoster Vaccines- Shingrix (1 of 2) Never done   TETANUS/TDAP  04/18/2021   INFLUENZA VACCINE  02/12/2022   COLONOSCOPY (Pts 45-82yr Insurance coverage will need to be confirmed)  09/09/2023   Hepatitis C Screening  Completed   HIV Screening  Completed   HPV VACCINES  Aged Out    Discussed health benefits of physical activity, and encouraged him to engage in regular exercise appropriate for his age and condition.  Problem List Items Addressed This Visit       Other   Annual physical exam - Primary    UTD on dental and vision Due for derm follow up Things to do to keep yourself healthy  - Exercise at least 30-45 minutes a day, 3-4 days a week.  - Eat a low-fat diet with lots of fruits and vegetables, up to 7-9 servings per day.  - Seatbelts can save your life. Wear them always.  - Smoke detectors on every level of your home, check batteries every year.  - Eye Doctor - have an eye exam every 1-2 years  - Safe sex - if you may be exposed to STDs, use a condom.  - Alcohol -  If you drink, do it moderately, less than 2 drinks per day.  - [place] Choose someone to speak for you if you are not able.  - Depression is common in our stressful world.If you're feeling down or losing interest in things you normally enjoy, please come in for a visit.  - Violence - If anyone is threatening  or hurting you, please call immediately.        Elevated serum glucose    Check A1c; hx of elevated glucose on CMP in 12/2021 Continue to recommend balanced, lower carb meals. Smaller  meal size, adding snacks. Choosing water as drink of choice and increasing purposeful exercise.       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     Vonna Kotyk, FNP, have reviewed all documentation for this visit. The documentation on 02/21/22 for the exam, diagnosis, procedures, and orders are all accurate and complete.    Gwyneth Sprout, [place] [phone] (phone) [phone] (fax)  [place]

## 2022-02-21 ENCOUNTER — Encounter: Payer: Self-pay | Admitting: Family Medicine

## 2022-02-21 ENCOUNTER — Ambulatory Visit (INDEPENDENT_AMBULATORY_CARE_PROVIDER_SITE_OTHER): Payer: PRIVATE HEALTH INSURANCE | Admitting: Family Medicine

## 2022-02-21 VITALS — BP 130/82 | HR 60 | Temp 98.0°F | Resp 16 | Ht 71.0 in | Wt 249.0 lb

## 2022-02-21 DIAGNOSIS — Z0000 Encounter for general adult medical examination without abnormal findings: Secondary | ICD-10-CM

## 2022-02-21 DIAGNOSIS — Z125 Encounter for screening for malignant neoplasm of prostate: Secondary | ICD-10-CM | POA: Diagnosis not present

## 2022-02-21 DIAGNOSIS — K439 Ventral hernia without obstruction or gangrene: Secondary | ICD-10-CM | POA: Diagnosis not present

## 2022-02-21 DIAGNOSIS — R739 Hyperglycemia, unspecified: Secondary | ICD-10-CM | POA: Diagnosis not present

## 2022-02-21 NOTE — Assessment & Plan Note (Signed)
Check A1c; hx of elevated glucose on CMP in 12/2021 Continue to recommend balanced, lower carb meals. Smaller meal size, adding snacks. Choosing water as drink of choice and increasing purposeful exercise.

## 2022-02-21 NOTE — Assessment & Plan Note (Signed)
UTD on dental and vision Due for derm follow up Things to do to keep yourself healthy  - Exercise at least 30-45 minutes a day, 3-4 days a week.  - Eat a low-fat diet with lots of fruits and vegetables, up to 7-9 servings per day.  - Seatbelts can save your life. Wear them always.  - Smoke detectors on every level of your home, check batteries every year.  - Eye Doctor - have an eye exam every 1-2 years  - Safe sex - if you may be exposed to STDs, use a condom.  - Alcohol -  If you drink, do it moderately, less than 2 drinks per day.  - [place]. Choose someone to speak for you if you are not able.  - Depression is common in our stressful world.If you're feeling down or losing interest in things you normally enjoy, please come in for a visit.  - Violence - If anyone is threatening or hurting you, please call immediately.

## 2022-02-21 NOTE — Assessment & Plan Note (Signed)
Denies LUTS; check PSA in place of DRE

## 2022-02-21 NOTE — Assessment & Plan Note (Signed)
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

## 2022-02-22 LAB — HEMOGLOBIN A1C
Est. average glucose Bld gHb Est-mCnc: 111 mg/dL
Hgb A1c MFr Bld: 5.5 % (ref 4.8–5.6)

## 2022-02-22 LAB — PSA: Prostate Specific Ag, Serum: 1.6 ng/mL (ref 0.0–4.0)

## 2022-02-22 NOTE — Progress Notes (Signed)
Stable PSA and no pre-diabetes.  Jorge Knox, [address] [place], [place] [address] [phone] (phone) [phone] (fax) [place]

## 2022-02-28 ENCOUNTER — Ambulatory Visit (INDEPENDENT_AMBULATORY_CARE_PROVIDER_SITE_OTHER): Payer: PRIVATE HEALTH INSURANCE | Admitting: Surgery

## 2022-02-28 ENCOUNTER — Encounter: Payer: Self-pay | Admitting: Surgery

## 2022-02-28 VITALS — BP 188/94 | HR 56 | Temp 97.6°F | Ht 71.0 in | Wt 245.6 lb

## 2022-02-28 DIAGNOSIS — M6208 Separation of muscle (nontraumatic), other site: Secondary | ICD-10-CM | POA: Diagnosis not present

## 2022-02-28 NOTE — Patient Instructions (Signed)
If you have any concerns or questions, please feel free to call our office. Follow up as needed.   Diastasis Recti  Diastasis recti is a condition in which the muscles of the abdomen (rectus abdominis muscles) become thin and separate. The result is a wider space between the muscles of the right and left abdomen (abdominal muscles). This wider space between the muscles may cause a bulge in the middle of the abdomen. This bulge may be noticed when a person is straining or when he or she sits up after lying down. Diastasis recti can affect men and women. It is most common among pregnant women, babies, people with obesity, and people who have had abdominal surgery. Exercise or surgery may help correct this condition. What are the causes? Common causes of this condition include: Pregnancy. As the uterus grows in size, it puts pressure on the abdominal muscles, causing the muscles to separate. Obesity. Excess fat puts pressure on abdominal muscles. Weight lifting. Some exercises of the abdomen. Advanced age. Genetics. Having had surgery on the abdomen before. What increases the risk? This condition is more likely to develop in: Women. Newborns, especially newborns who are born early (prematurely). What are the signs or symptoms? Common symptoms of this condition include: A bulge in the middle of your abdomen. You will notice it most when you sit up or strain. Pain in your low back, hips, or the area between your hip bones (pelvis). Constipation. Being unable to control when you urinate (urinary incontinence). Bloating. Poor posture. How is this diagnosed? This condition is diagnosed with a physical exam. During the exam, your health care provider will ask you to lie flat on your back and do a crunch or half sit-up. If you have diastasis recti, a bulge will appear lengthwise between your abdominal muscles in the center of your abdomen. Your health care provider will measure the gap between your  muscles with one of the following: A medical device used to measure the space between two objects (caliper). A tape measure. CT scan. Ultrasound. Finger spaces. Your health care provider will measure the space using his or her fingers. How is this treated? If your muscle separation is not too large, you may not need treatment. However, if you are a woman who plans to become pregnant again, you should treat this condition before your next pregnancy. Treatment may include: Physical therapy exercises to strengthen and tighten your abdominal muscles. Lifestyle changes such as weight loss and exercise. Over-the-counter pain medicines as needed. Surgery to correct the separation. Follow these instructions at home: Activity Return to your normal activities as told by your health care provider. Ask your health care provider what activities are safe for you. Do exercises as told by your health care provider. Make sure you are doing your exercises and movements correctly when lifting weights or doing exercises using your abdominal muscles or the muscles in the center of your body that give stability (core muscles). Proper form can help to prevent this condition from happening again. General instructions If you are overweight, ask your health care provider for help with weight loss. Losing even a small amount of weight can help to improve your diastasis recti. Take over-the-counter or prescription medicines only as told by your health care provider. Do not strain. Straining can make the separation worse. Examples of straining include: Pushing hard to have a bowel movement, such as when you have constipation. Lifting heavy objects or lifting children. Standing up and sitting down. You may  need to take these actions to prevent or treat constipation: Drink enough fluid to keep your urine pale yellow. Take over-the-counter or prescription medicines. Eat foods that are high in fiber, such as beans, whole  grains, and fresh fruits and vegetables. Limit foods that are high in fat and processed sugars, such as fried or sweet foods. Keep all follow-up visits. This is important. Contact a health care provider if: You notice a new bulge in your abdomen. Get help right away if: You experience severe discomfort in your abdomen. You develop severe abdominal pain along with nausea, vomiting, or a fever. Summary Diastasis recti is a condition in which the muscles of the abdomen (rectus abdominismuscles) become thin and separate. You may notice a bulge in your abdomen because the space has widened between the muscles of the right and left abdomen. The most common symptom is a bulge in the middle of your abdomen. You will notice it most when you sit up or strain. This condition is diagnosed with a physical exam. If the muscle separation is not too big, you may not need treatment. Otherwise, you may need to do physical therapy or have surgery. This information is not intended to replace advice given to you by your health care provider. Make sure you discuss any questions you have with your health care provider. Document Revised: 03/03/2020 Document Reviewed: 03/03/2020 Elsevier Patient Education  [place].

## 2022-02-28 NOTE — Progress Notes (Signed)
Patient ID: Jorge Knox, male   DOB: 03-Aug-1955, 66 y.o.   MRN: [id number]  Chief Complaint: Concerns raised regarding the ventral hernia  History of Present Illness Jorge Knox is a 66 y.o. male with a longstanding epigastric midline bulge.  Minimal soreness after core exercises, crunches or sit ups.  No history of laparotomy, or laparoscopic procedures.  No complaint of umbilical bulge or periumbilical pain.  No bowel habit changes, nausea or vomiting.  No localized areas of tenderness or pain.  Gradually progressive over the last 15 years.   Past Medical History Past Medical History:  Diagnosis Date   H/O seasonal allergies    Hyperlipidemia       Past Surgical History:  Procedure Laterality Date   COLONOSCOPY WITH PROPOFOL N/A 09/08/2020   Procedure: COLONOSCOPY WITH PROPOFOL;  Surgeon: Virgel Manifold, MD;  Location: [facility] ENDOSCOPY;  Service: Endoscopy;  Laterality: N/A;  COVID POSITIVE 08/09/2020   WRIST SURGERY Right    pinning    Allergies  Allergen Reactions   Cefdinir Nausea Only    Current Outpatient Medications  Medication Sig Dispense Refill   atorvastatin (LIPITOR) 20 MG tablet Take 1 tablet (20 mg total) by mouth daily. 90 tablet 3   No current facility-administered medications for this visit.    Family History History reviewed. No pertinent family history.    Social History Social History   Tobacco Use   Smoking status: Never   Smokeless tobacco: Never  Substance Use Topics   Alcohol use: Not Currently    Alcohol/week: 0.0 standard drinks of alcohol   Drug use: Never        Review of Systems  Constitutional: Negative.   HENT: Negative.    Eyes: Negative.   Respiratory: Negative.    Cardiovascular: Negative.   Gastrointestinal: Negative.   Genitourinary: Negative.   Skin: Negative.   Neurological: Negative.   Psychiatric/Behavioral: Negative.        Physical Exam Blood pressure (!) 188/94, pulse (!) 56, temperature 97.6 F  (36.4 C), temperature source Oral, height '5\' 11"'$  (1.803 m), weight 245 lb 9.6 oz (111.4 kg), SpO2 96 %. Last Weight  Most recent update: 02/28/2022  9:50 AM    Weight  111.4 kg (245 lb 9.6 oz)             CONSTITUTIONAL: Well developed, and nourished, appropriately responsive and aware without distress.   EYES: Sclera non-icteric.   EARS, NOSE, MOUTH AND THROAT:  The oropharynx is clear. Oral mucosa is pink and moist.    Hearing is intact to voice.  NECK: Trachea is midline, and there is no jugular venous distension.  LYMPH NODES:  Lymph nodes in the neck are not enlarged. RESPIRATORY:  Lungs are clear, and breath sounds are equal bilaterally. Normal respiratory effort without pathologic use of accessory muscles. CARDIOVASCULAR: Heart is regular in rate and rhythm. GI: The abdomen is notable for a prominent diastases recti, with no focal nor appreciable fascial defect.  Otherwise soft, nontender, and nondistended. There were no palpable masses. I did not appreciate hepatosplenomegaly.  MUSCULOSKELETAL:  Symmetrical muscle tone appreciated in all four extremities.    SKIN: Skin turgor is normal. No pathologic skin lesions appreciated.  NEUROLOGIC:  Motor and sensation appear grossly normal.  Cranial nerves are grossly without defect. PSYCH:  Alert and oriented to person, place and time. Affect is appropriate for situation.  Data Reviewed I have personally reviewed what is currently available of the patient's imaging, recent labs  and medical records.   Labs:     Latest Ref Rng & Units 05/08/2020    8:15 AM  CBC  WBC 3.4 - 10.8 x10E3/uL 5.8   Hemoglobin 13.0 - 17.7 g/dL 17.1   Hematocrit 37.5 - 51.0 % 49.4   Platelets 150 - 450 x10E3/uL 234       Latest Ref Rng & Units 12/27/2021    8:06 AM 10/18/2020    8:39 AM 05/08/2020    8:15 AM  CMP  Glucose 70 - 99 mg/dL 109  91  103   BUN 8 - 27 mg/dL '11  18  11   '$ Creatinine 0.76 - 1.27 mg/dL 1.08  1.06  1.07   Sodium 134 - 144 mmol/L  141  138  139   Potassium 3.5 - 5.2 mmol/L 5.2  4.8  4.8   Chloride 96 - 106 mmol/L 105  103  104   CO2 20 - 29 mmol/L '23  20  22   '$ Calcium 8.6 - 10.2 mg/dL 9.7  9.2  9.7   Total Protein 6.0 - 8.5 g/dL 6.5  6.9  6.7   Total Bilirubin 0.0 - 1.2 mg/dL 0.9  1.1  0.7   Alkaline Phos 44 - 121 IU/L 77  69  68   AST 0 - 40 IU/L '17  24  21   '$ ALT 0 - 44 IU/L 29  41  42       Imaging:  Within last 24 hrs: No results found.  Assessment    Diastases recti.  Patient Active Problem List   Diagnosis Date Noted   Elevated serum glucose 02/21/2022   Prostate cancer screening 02/21/2022   Annual physical exam 02/21/2022   Ventral hernia without obstruction or gangrene 02/21/2022   Encounter for screening colonoscopy    Polyp of colon    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   BCC (basal cell carcinoma of skin) 09/04/2018   Hyperlipidemia 09/18/2015   Allergic rhinitis 09/18/2015    Plan    As there is no actual hernia, and no significant abdominal wall laxity, any attempt to repair or reapproximate these abdominal wall muscles will likely provoke far more pain and increased risk compared to continuing observation.  The current eventration of the abdominal wall secondary to the widened linea alba currently presents no risk. We discussed to continue observation for possible focal defects of the linea alba, and what this may present like.  We also discussed the potential that should he lose significant weight and have sufficient laxity that possible abdominoplasty or reapproximation may be entertained.  But at present this would be/require a significant abdominal wall reconstruction in order to reapproximate his recti.  I believe the trade off is far from worthwhile in an asymptomatic gentleman without actual fascial defect.  Face-to-face time spent with the patient and accompanying care providers(if present) was 30 minutes, with more than 50% of the time spent counseling, educating, and  coordinating care of the patient.    These notes generated with voice recognition software. I apologize for typographical errors.  Ronny Bacon M.D., FACS 02/28/2022, 10:18 AM

## 2022-04-16 ENCOUNTER — Ambulatory Visit (INDEPENDENT_AMBULATORY_CARE_PROVIDER_SITE_OTHER): Payer: PRIVATE HEALTH INSURANCE | Admitting: Dermatology

## 2022-04-16 ENCOUNTER — Encounter: Payer: Self-pay | Admitting: Dermatology

## 2022-04-16 DIAGNOSIS — Z85828 Personal history of other malignant neoplasm of skin: Secondary | ICD-10-CM

## 2022-04-16 DIAGNOSIS — L578 Other skin changes due to chronic exposure to nonionizing radiation: Secondary | ICD-10-CM

## 2022-04-16 DIAGNOSIS — L821 Other seborrheic keratosis: Secondary | ICD-10-CM

## 2022-04-16 DIAGNOSIS — D2371 Other benign neoplasm of skin of right lower limb, including hip: Secondary | ICD-10-CM

## 2022-04-16 DIAGNOSIS — Z1283 Encounter for screening for malignant neoplasm of skin: Secondary | ICD-10-CM

## 2022-04-16 DIAGNOSIS — L814 Other melanin hyperpigmentation: Secondary | ICD-10-CM

## 2022-04-16 DIAGNOSIS — D1801 Hemangioma of skin and subcutaneous tissue: Secondary | ICD-10-CM

## 2022-04-16 NOTE — Patient Instructions (Addendum)
[facility] Skin   Melanoma ABCDEs  Melanoma is the most dangerous type of skin cancer, and is the leading cause of death from skin disease.  You are more likely to develop melanoma if you: Have light-colored skin, light-colored eyes, or red or blond hair Spend a lot of time in the sun Tan regularly, either outdoors or in a tanning bed Have had blistering sunburns, especially during childhood Have a close family member who has had a melanoma Have atypical moles or large birthmarks  Early detection of melanoma is key since treatment is typically straightforward and cure rates are extremely high if we catch it early.   The first sign of melanoma is often a change in a mole or a new dark spot.  The ABCDE system is a way of remembering the signs of melanoma.  A for asymmetry:  The two halves do not match. B for border:  The edges of the growth are irregular. C for color:  A mixture of colors are present instead of an even brown color. D for diameter:  Melanomas are usually (but not always) greater than 58m - the size of a pencil eraser. E for evolution:  The spot keeps changing in size, shape, and color.  Please check your skin once per month between visits. You can use a small mirror in front and a large mirror behind you to keep an eye on the back side or your body.   If you see any new or changing lesions before your next follow-up, please call to schedule a visit.  Please continue daily skin protection including broad spectrum sunscreen SPF 30+ to sun-exposed areas, reapplying every 2 hours as needed when you're outdoors.    Due to recent changes in healthcare laws, you may see results of your pathology and/or laboratory studies on MyChart before the doctors have had a chance to review them. We understand that in some cases there may be results that are confusing or concerning to you. Please understand that not all results are received at the same time and often the doctors may need  to interpret multiple results in order to provide you with the best plan of care or course of treatment. Therefore, we ask that you please give u[place]2 business days to thoroughly review all your results before contacting the office for clarification. Should we see a critical lab result, you will be contacted sooner.   If You Need Anything After Your Visit  If you have any questions or concerns for your doctor, please call our main line at [phone] press option 4 to reach your doctor's medical assistant. If no one answers, please leave a voicemail as directed and we will return your call as soon as possible. Messages left after 4 pm will be answered the following business day.   You may also send [place] message via [place] We typically respond to MyChart messages within 1-2 business days.  For prescription refills, please ask your pharmacy to contact our office. Our fax number is [phone]  If you have an urgent issue when the clinic is closed that cannot wait until the next business day, you can page your doctor at the number below.    Please note that while we do our best to be available for urgent issues outside of office hours, we are not available 24/7.   If you have an urgent issue and are unable to reach u[place] you may choose to seek medical care at your doctor's  office, retail clinic, urgent care center, or emergency room.  If you have a medical emergency, please immediately call 911 or go to the emergency department.  Pager Numbers  - Dr. Nehemiah Massed: [phone]  - Dr. Laurence Ferrari: [phone]  - Dr. Nicole Kindred: [phone]  In the event of inclement weather, please call our main line at [phone] for an update on the status of any delays or closures.  Dermatology Medication Tips: Please keep the boxes that topical medications come in in order to help keep track of the instructions about where and how to use these. Pharmacies typically print the medication instructions only on  the boxes and not directly on the medication tubes.   If your medication is too expensive, please contact our office at [phone] option 4 or send [place] a message through [place].   We are unable to tell what your co-pay for medications will be in advance as this is different depending on your insurance coverage. However, we may be able to find a substitute medication at lower cost or fill out paperwork to get insurance to cover a needed medication.   If a prior authorization is required to get your medication covered by your insurance company, please allow [place] 1-2 business days to complete this process.  Drug prices often vary depending on where the prescription is filled and some pharmacies may offer cheaper prices.  The website www.goodrx.com contains coupons for medications through different pharmacies. The prices here do not account for what the cost may be with help from insurance (it may be cheaper with your insurance), but the website can give you the price if you did not use any insurance.  - You can print the associated coupon and take it with your prescription to the pharmacy.  - You may also stop by our office during regular business hours and pick up a GoodRx coupon card.  - If you need your prescription sent electronically to a different pharmacy, notify our office through [facility] or by phone at [phone] option 4.     Si Usted Necesita Algo Despus de Su Visita  Tambin puede enviarnos un mensaje a travs de Pharmacist, community. Por lo general respondemos a los mensajes de MyChart en el transcurso de 1 a 2 das hbiles.  Para renovar recetas, por favor pida a su farmacia que se ponga en contacto con nuestra oficina. Harland Dingwall de fax es [place] [phone].  Si tiene un asunto urgente cuando la clnica est cerrada y que no puede esperar hasta el siguiente da hbil, puede llamar/localizar a su doctor(a) al nmero que aparece a continuacin.   Por favor, tenga en cuenta que  aunque hacemos todo lo posible para estar disponibles para asuntos urgentes fuera del horario de [place], no estamos disponibles las 24 horas del da, los 7 das de la [place].   Si tiene un problema urgente y no puede comunicarse con nosotros, puede optar por buscar atencin mdica  en el consultorio de su doctor(a), en una clnica privada, en un centro de atencin urgente o en una sala de emergencias.  Si tiene Engineering geologist, por favor llame inmediatamente al 911 o vaya a la sala de emergencias.  Nmeros de bper  - Dr. Nehemiah Massed: [phone]  - Dra. Moye: [phone]  - Dra. Nicole Kindred: [phone]  En caso de inclemencias del [place], por favor llame a Johnsie Kindred principal al [phone] para una actualizacin sobre el [place] de cualquier retraso o cierre.  Consejos para la medicacin en dermatologa: Por favor,  guarde las cajas en las que vienen los medicamentos de uso tpico para ayudarle a seguir las instrucciones sobre dnde y cmo usarlos. Las farmacias generalmente imprimen las instrucciones del medicamento slo en las cajas y no directamente en los tubos del [place].   Si su medicamento es muy caro, por favor, pngase en contacto con Zigmund Daniel llamando al [phone] y presione la opcin 4 o envenos un mensaje a travs de Pharmacist, community.   No podemos decirle cul ser su copago por los medicamentos por adelantado ya que esto es diferente dependiendo de la cobertura de su seguro. Sin embargo, es posible que podamos encontrar un medicamento sustituto a Electrical engineer un formulario para que el seguro cubra el medicamento que se considera necesario.   Si se requiere una autorizacin previa para que su compaa de seguros [place] su medicamento, por favor permtanos de 1 a 2 das hbiles para completar este proceso.  Los precios de los medicamentos varan con frecuencia dependiendo del Environmental consultant de dnde se surte la receta y alguna farmacias pueden ofrecer precios ms  baratos.  El sitio web www.goodrx.com tiene cupones para medicamentos de Airline pilot. Los precios aqu no tienen en cuenta lo que podra costar con la ayuda del seguro (puede ser ms barato con su seguro), pero el sitio web puede darle el precio si no utiliz Research scientist (physical sciences).  - Puede imprimir el cupn correspondiente y llevarlo con su receta a la farmacia.  - Tambin puede pasar por nuestra oficina durante el horario de atencin regular y Charity fundraiser una tarjeta de cupones de GoodRx.  - Si necesita que su receta se enve electrnicamente a una farmacia diferente, informe a nuestra oficina a travs de MyChart de  o por telfono llamando al [phone] y presione la opcin 4.

## 2022-04-16 NOTE — Progress Notes (Signed)
   New Patient Visit  Subjective  Jorge Knox is a 66 y.o. male who presents for the following: Annual Exam. The patient presents for Total-Body Skin Exam (TBSE) for skin cancer screening and mole check.  The patient has spots, moles and lesions to be evaluated, some may be new or changing and the patient has concerns that these could be cancer.    The following portions of the chart were reviewed this encounter and updated as appropriate:   Tobacco  Allergies  Meds  Problems  Med Hx  Surg Hx  Fam Hx      Review of Systems:  No other skin or systemic complaints except as noted in HPI or Assessment and Plan.  Objective  Well appearing patient in no apparent distress; mood and affect are within normal limits.  A full examination was performed including scalp, head, eyes, ears, nose, lips, neck, chest, axillae, abdomen, back, buttocks, bilateral upper extremities, bilateral lower extremities, hands, feet, fingers, toes, fingernails, and toenails. All findings within normal limits unless otherwise noted below.    Assessment & Plan   Dermatofibroma - Firm pink/brown papulenodule with dimple sign at right lateral calf - Benign appearing - Call for any changes  Lentigines - Scattered tan macules - Due to sun exposure - Benign-appearing, observe - Recommend daily broad spectrum sunscreen SPF 30+ to sun-exposed areas, reapply every 2 hours as needed. - Call for any changes  Seborrheic Keratoses - Stuck-on, waxy, tan-brown papules and/or plaques  - Benign-appearing - Discussed benign etiology and prognosis. - Observe - Call for any changes  Melanocytic Nevi - Tan-brown and/or pink-flesh-colored symmetric macules and papules - Benign appearing on exam today - Observation - Call clinic for new or changing moles - Recommend daily use of broad spectrum spf 30+ sunscreen to sun-exposed areas.   Hemangiomas - Red papules - Discussed benign nature - Observe - Call for  any changes  Actinic Damage - Chronic condition, secondary to cumulative UV/sun exposure - diffuse scaly erythematous macules with underlying dyspigmentation - Recommend daily broad spectrum sunscreen SPF 30+ to sun-exposed areas, reapply every 2 hours as needed.  - Staying in the shade or wearing long sleeves, sun glasses (UVA+UVB protection) and wide brim hats (4-inch brim around the entire circumference of the hat) are also recommended for sun protection.  - Call for new or changing lesions.  Skin cancer screening performed today.  History of Basal Cell Carcinoma of the Skin - No evidence of recurrence today - Recommend regular full body skin exams - Recommend daily broad spectrum sunscreen SPF 30+ to sun-exposed areas, reapply every 2 hours as needed.  - Call if any new or changing lesions are noted between office visits  Return in about 1 year (around 04/17/2023) for TBSE.   I, Marye Round, CMA, am acting as scribe for Forest Gleason, MD .   Documentation: I have reviewed the above documentation for accuracy and completeness, and I agree with the above.  Forest Gleason, MD

## 2022-04-26 ENCOUNTER — Encounter: Payer: Self-pay | Admitting: Dermatology

## 2022-07-19 ENCOUNTER — Other Ambulatory Visit: Payer: Self-pay | Admitting: Family Medicine

## 2022-07-19 MED ORDER — ATORVASTATIN CALCIUM 20 MG PO TABS
20.0000 mg | ORAL_TABLET | Freq: Every day | ORAL | 0 refills | Status: DC
Start: 2022-07-19 — End: 2022-10-14

## 2022-07-19 NOTE — Telephone Encounter (Signed)
Requested Prescriptions  Pending Prescriptions Disp Refills   atorvastatin (LIPITOR) 20 MG tablet 90 tablet 0    Sig: Take 1 tablet (20 mg total) by mouth daily.     Cardiovascular:  Antilipid - Statins Failed - 07/19/2022  1:22 PM      Failed - Lipid Panel in normal range within the last 12 months    Cholesterol, Total  Date Value Ref Range Status  12/27/2021 107 100 - 199 mg/dL Final   LDL Chol Calc (NIH)  Date Value Ref Range Status  12/27/2021 61 0 - 99 mg/dL Final   HDL  Date Value Ref Range Status  12/27/2021 33 (L) >39 mg/dL Final   Triglycerides  Date Value Ref Range Status  12/27/2021 56 0 - 149 mg/dL Final         Passed - Patient is not pregnant      Passed - Valid encounter within last 12 months    Recent Outpatient Visits           4 months ago Annual physical exam   [facility] Tally Joe T, FNP   7 months ago Mixed hyperlipidemia   [facility] Jerrol Banana., MD   1 year ago Hypercholesterolemia   [facility] [place], Clearnce Sorrel, [place]   2 years ago Annual physical exam   [address], Vickki Muff, PA-C   3 years ago Mixed hyperlipidemia   [place], [place], [place]

## 2022-07-19 NOTE — Telephone Encounter (Signed)
Medication Refill - Medication: atorvastatin (LIPITOR) 20 MG tablet   Has been out for a week    Has the patient contacted their pharmacy? Yes.   (Agent: If no, request that the patient contact the pharmacy for the refill. If patient does not wish to contact the pharmacy document the reason why and proceed with request.) (Agent: If yes, when and what did the pharmacy advise?)  Preferred Pharmacy (with phone number or street name):   [id number] [address] [id number] Phone: [phone]: [phone]  Has the patient been seen for an appointment in the last year OR does the patient have an upcoming appointment? Yes.    Agent: Please be advised that RX refills may take up to 3 business days. We ask that you follow-up with your pharmacy.

## 2022-10-14 ENCOUNTER — Other Ambulatory Visit: Payer: Self-pay | Admitting: Family Medicine

## 2023-02-12 ENCOUNTER — Ambulatory Visit: Payer: Self-pay

## 2023-02-12 ENCOUNTER — Telehealth: Payer: Self-pay

## 2023-02-12 NOTE — Telephone Encounter (Signed)
  Chief Complaint: elevated BP Symptoms: h/o headache (no H/A during call)  Pertinent Negatives: Patient denies weakness or numbness to face, arms legs, trouble speaking or vision Disposition: [] ED /[] Urgent Care (no appt availability in office) / [x] Appointment(In office/virtual)/ []  [place] [facility]/ [] Home Care/ [] Refused Recommended Disposition /[] [place] [facility]/ []  Follow-up with PCP Additional Notes:- Reason for Disposition . [1] Systolic BP  >= 130 OR Diastolic >= 80 AND [2] not taking BP medications  Answer Assessment - Initial Assessment Questions 1. BLOOD PRESSURE: "What is the blood pressure?" "Did you take at least two measurements 5 minutes apart?"     151/90-210/105 recheck 148/90 2. ONSET: "When did you take your blood pressure?"     Today  3. HOW: "How did you take your blood pressure?" (e.g., automatic home BP monitor, visiting nurse)     [employer]  4. HISTORY: "Do you have a history of high blood pressure?"     no 5. MEDICINES: "Are you taking any medicines for blood pressure?" "Have you missed any doses recently?"     N/a 6. OTHER SYMPTOMS: "Do you have any symptoms?" (e.g., blurred vision, chest pain, difficulty breathing, headache, weakness)     headache  Protocols used: Blood Pressure - High-A-AH

## 2023-02-12 NOTE — Telephone Encounter (Signed)
LVM for patient to call back [phone], or to call PCP office to schedule follow up apt. AS, CMA  

## 2023-02-17 ENCOUNTER — Ambulatory Visit (INDEPENDENT_AMBULATORY_CARE_PROVIDER_SITE_OTHER): Payer: PPO | Admitting: Family Medicine

## 2023-02-17 ENCOUNTER — Encounter: Payer: Self-pay | Admitting: Family Medicine

## 2023-02-17 VITALS — BP 133/78 | HR 71 | Temp 98.1°F | Ht 71.0 in | Wt 251.5 lb

## 2023-02-17 DIAGNOSIS — E782 Mixed hyperlipidemia: Secondary | ICD-10-CM

## 2023-02-17 DIAGNOSIS — I10 Essential (primary) hypertension: Secondary | ICD-10-CM

## 2023-02-17 MED ORDER — LOSARTAN POTASSIUM-HCTZ 50-12.5 MG PO TABS: 1.0000 | ORAL_TABLET | Freq: Every day | ORAL | 3 refills | Status: DC

## 2023-02-17 NOTE — Assessment & Plan Note (Signed)
Chronic, LDL previously stable on statin recommend diet low in saturated fat and regular exercise - 30 min at least 5 times per week Plan to repeat LP with CPE Compliant with lipitor 20 mg

## 2023-02-17 NOTE — Assessment & Plan Note (Signed)
Chronic, stable In setting of HTN and HLD Body mass index is 35.08 kg/m. Continue to recommend balanced, lower carb meals. Smaller meal size, adding snacks. Choosing water as drink of choice and increasing purposeful exercise.

## 2023-02-17 NOTE — Progress Notes (Signed)
Established patient visit   Patient: Jorge Knox   DOB: 12-04-1955   67 y.o. Male  MRN: [id number] Visit Date: 02/17/2023  Today's healthcare provider: Jacky Kindle, FNP  Introduced to nurse practitioner role and practice setting.  All questions answered.  Discussed provider/patient relationship and expectations.  Subjective    HPI HPI     Medical Management of Chronic Issues    Additional comments: Follow up on Bp      Last edited by Rolly Salter, CMA on 02/17/2023  1:37 PM.      Medications: Outpatient Medications Prior to Visit  Medication Sig   atorvastatin (LIPITOR) 20 MG tablet TAKE 1 TABLET BY MOUTH EVERY DAY   No facility-administered medications prior to visit.     Objective    BP 133/78 (BP Location: Right Arm, Patient Position: Sitting, Cuff Size: Large)   Pulse 71   Temp 98.1 F (36.7 C) (Oral)   Ht 5\' 11"  (1.803 m)   Wt 251 lb 8 oz (114.1 kg)   SpO2 97%   BMI 35.08 kg/m   Physical Exam Vitals and nursing note reviewed.  Constitutional:      Appearance: Normal appearance. He is obese.  HENT:     Head: Normocephalic and atraumatic.  Cardiovascular:     Rate and Rhythm: Normal rate and regular rhythm.     Pulses: Normal pulses.     Heart sounds: Normal heart sounds.  Pulmonary:     Effort: Pulmonary effort is normal.     Breath sounds: Normal breath sounds.  Musculoskeletal:        General: Normal range of motion.     Cervical back: Normal range of motion.  Skin:    General: Skin is warm and dry.     Capillary Refill: Capillary refill takes less than 2 seconds.  Neurological:     General: No focal deficit present.     Mental Status: He is alert and oriented to person, place, and time. Mental status is at baseline.  Psychiatric:        Mood and Affect: Mood normal.        Behavior: Behavior normal.        Thought Content: Thought content normal.        Judgment: Judgment normal.    No results found for any visits on  02/17/23.  Assessment & Plan     Problem List Items Addressed This Visit       Cardiovascular and Mediastinum   Primary hypertension - Primary    Acute on chronic, not previously treated BP has been 200-210s/100-120s at home Start Hyzaar 50-12.5 1 month f/u recommended with labs Goal 129/79 given HLD and obesity       Relevant Medications   losartan-hydrochlorothiazide (HYZAAR) 50-12.5 MG tablet     Other   Hyperlipidemia    Chronic, LDL previously stable on statin recommend diet low in saturated fat and regular exercise - 30 min at least 5 times per week Plan to repeat LP with CPE Compliant with lipitor 20 mg       Relevant Medications   losartan-hydrochlorothiazide (HYZAAR) 50-12.5 MG tablet   Morbid obesity (HCC)    Chronic, stable In setting of HTN and HLD Body mass index is 35.08 kg/m. Continue to recommend balanced, lower carb meals. Smaller meal size, adding snacks. Choosing water as drink of choice and increasing purposeful exercise.       Return in about 4 weeks (around 03/17/2023)  for annual examination.     Leilani Merl, FNP, have reviewed all documentation for this visit. The documentation on 02/17/23 for the exam, diagnosis, procedures, and orders are all accurate and complete.  Jacky Kindle, FNP  [facility] [phone] (phone) [phone] (fax)  [facility]

## 2023-02-17 NOTE — Assessment & Plan Note (Signed)
Acute on chronic, not previously treated BP has been 200-210s/100-120s at home Start Hyzaar 50-12.5 1 month f/u recommended with labs Goal 129/79 given HLD and obesity

## 2023-03-27 ENCOUNTER — Ambulatory Visit (INDEPENDENT_AMBULATORY_CARE_PROVIDER_SITE_OTHER): Payer: PPO | Admitting: Family Medicine

## 2023-03-27 ENCOUNTER — Encounter: Payer: Self-pay | Admitting: Family Medicine

## 2023-03-27 VITALS — BP 123/68 | HR 68 | Temp 98.1°F | Ht 71.0 in | Wt 247.0 lb

## 2023-03-27 DIAGNOSIS — I10 Essential (primary) hypertension: Secondary | ICD-10-CM

## 2023-03-27 MED ORDER — LISINOPRIL 20 MG PO TABS
20.0000 mg | ORAL_TABLET | Freq: Every day | ORAL | 11 refills | Status: DC
Start: 2023-03-27 — End: 2023-07-04

## 2023-03-27 NOTE — Progress Notes (Signed)
Established patient visit   Patient: Jorge Knox   DOB: [birth year]   67 y.o. Male  MRN: [id number] Visit Date: 03/27/2023  Today's healthcare provider: Jacky Kindle, FNP  Introduced to nurse practitioner role and practice setting.  All questions answered.  Discussed provider/patient relationship and expectations.  Subjective    HPI HPI     Hypertension    Additional comments: Patient was seen 1 month and was found to have an elevated blood pressure.  He was started on Losartan hydrochlorothiazide.  Patient has since discontinued this medication due to side effects.  He states that the last visit he had been under an increased stress and he had been eating a lot of pork that week.  He states he has been checking his blood pressure at home and getting good readings.        Last edited by Adline Peals, CMA on 03/27/2023  8:42 AM.      Medications: Outpatient Medications Prior to Visit  Medication Sig   atorvastatin (LIPITOR) 20 MG tablet TAKE 1 TABLET BY MOUTH EVERY DAY   [DISCONTINUED] losartan-hydrochlorothiazide (HYZAAR) 50-12.5 MG tablet Take 1 tablet by mouth daily. (Patient not taking: Reported on 03/27/2023)   No facility-administered medications prior to visit.     Objective    BP 123/68 (BP Location: Right Arm, Patient Position: Sitting, Cuff Size: Large)   Pulse 68   Temp 98.1 F (36.7 C) (Oral)   Ht 5\' 11"  (1.803 m)   Wt 247 lb (112 kg)   SpO2 97%   BMI 34.45 kg/m   Physical Exam Vitals and nursing note reviewed.  Constitutional:      Appearance: Normal appearance. He is obese.  HENT:     Head: Normocephalic and atraumatic.  Cardiovascular:     Rate and Rhythm: Normal rate and regular rhythm.     Pulses: Normal pulses.     Heart sounds: Normal heart sounds.  Pulmonary:     Effort: Pulmonary effort is normal.     Breath sounds: Normal breath sounds.  Musculoskeletal:        General: Normal range of motion.     Cervical back: Normal range of  motion.  Skin:    General: Skin is warm and dry.     Capillary Refill: Capillary refill takes less than 2 seconds.  Neurological:     General: No focal deficit present.     Mental Status: He is alert and oriented to person, place, and time. Mental status is at baseline.  Psychiatric:        Mood and Affect: Mood normal.        Behavior: Behavior normal.        Thought Content: Thought content normal.        Judgment: Judgment normal.     No results found for any visits on 03/27/23.  Assessment & Plan     Problem List Items Addressed This Visit       Cardiovascular and Mediastinum   Primary hypertension - Primary    Chronic, stable on home numbers However, elevated risk of stroke/MI given obesity and HLD, controlled with statin 20 lipitor Reports "swimmy headedness" with hyzaar 50-12.5 Will stop and switch to ACEi without hydrochlorothiazide 1 month f/u provided Continue DASH diet to assist      Relevant Medications   lisinopril (ZESTRIL) 20 MG tablet    Return in about 4 weeks (around 04/24/2023) for HTN management.  Leilani Merl, FNP, have reviewed all documentation for this visit. The documentation on 03/27/23 for the exam, diagnosis, procedures, and orders are all accurate and complete.  Jacky Kindle, FNP  [facility] [phone] (phone) [phone] (fax)  [facility]

## 2023-03-27 NOTE — Assessment & Plan Note (Signed)
Chronic, stable on home numbers However, elevated risk of stroke/MI given obesity and HLD, controlled with statin 20 lipitor Reports "swimmy headedness" with hyzaar 50-12.5 Will stop and switch to ACEi without hydrochlorothiazide 1 month f/u provided Continue DASH diet to assist

## 2023-04-08 ENCOUNTER — Other Ambulatory Visit: Payer: Self-pay | Admitting: Family Medicine

## 2023-04-14 ENCOUNTER — Ambulatory Visit (INDEPENDENT_AMBULATORY_CARE_PROVIDER_SITE_OTHER): Payer: PPO | Admitting: Family Medicine

## 2023-04-14 DIAGNOSIS — J019 Acute sinusitis, unspecified: Secondary | ICD-10-CM | POA: Insufficient documentation

## 2023-04-14 MED ORDER — HYDROCOD POLI-CHLORPHE POLI ER 10-8 MG/5ML PO SUER: 5.0000 mL | Freq: Two times a day (BID) | ORAL | 0 refills | Status: DC | PRN

## 2023-04-14 MED ORDER — AZITHROMYCIN 500 MG PO TABS: 500.0000 mg | ORAL_TABLET | Freq: Every day | ORAL | 0 refills | Status: DC

## 2023-04-14 NOTE — Progress Notes (Signed)
    Virtual telephone visit    Virtual Visit via Telephone Note   This format is felt to be most appropriate for this patient at this time. The patient did not have access to video technology or had technical difficulties with video requiring transitioning to audio format only (telephone). Physical exam was limited to content and character of the telephone converstion.    Patient location: home Provider location: [facility] [address]  [address] [place], [place] [address]  I discussed the limitations of evaluation and management by telemedicine and the availability of in person appointments. The patient expressed understanding and agreed to proceed.   Visit Date: 04/14/2023  Today's healthcare provider: Jacky Kindle, FNP  Re Introduced to nurse practitioner role and practice setting.  All questions answered.  Discussed provider/patient relationship and expectations.  Subjective    HPI   Complaints of 5 days of URI symptoms including cough/tickle in throat, sinus congestion and nasal congestion. Reports no known exposure to COVID. Reports wife recently had something similar and she needed an ABX. Pt denies fevers, chills, anorexia, etc.  Encourage to make an appt if not improved given no assessment data with phone note.  Medications: Outpatient Medications Prior to Visit  Medication Sig   atorvastatin (LIPITOR) 20 MG tablet TAKE 1 TABLET BY MOUTH EVERY DAY   lisinopril (ZESTRIL) 20 MG tablet Take 1 tablet (20 mg total) by mouth daily.   No facility-administered medications prior to visit.    Objective    There were no vitals taken for this visit.   Assessment & Plan     Problem List Items Addressed This Visit       Respiratory   Acute non-recurrent sinusitis - Primary    Pt endorses 5 days of URI symptoms including sinus and nasal congestion; reports "stopped up" failed use of Mucinex, Sudafed, Dayquil/Nyquil Request for ABX and "good" cough  syrup Recommend follow up in office      Relevant Medications   chlorpheniramine-HYDROcodone (TUSSIONEX) 10-8 MG/5ML   azithromycin (ZITHROMAX) 500 MG tablet   No follow-ups on file.   I discussed the assessment and treatment plan with the patient. The patient was provided an opportunity to ask questions and all were answered. The patient agreed with the plan and demonstrated an understanding of the instructions.   The patient was advised to call back or seek an in-person evaluation if the symptoms worsen or if the condition fails to improve as anticipated.  I provided 10 minutes of non-face-to-face time during this encounter.  Leilani Merl, FNP, have reviewed all documentation for this visit. The documentation on 04/14/23 for the exam, diagnosis, procedures, and orders are all accurate and complete.  Jacky Kindle, FNP [facility] [phone] (phone) [phone] (fax)  [facility]

## 2023-04-14 NOTE — Assessment & Plan Note (Signed)
Pt endorses 5 days of URI symptoms including sinus and nasal congestion; reports "stopped up" failed use of Mucinex, Sudafed, Dayquil/Nyquil Request for ABX and "good" cough syrup Recommend follow up in office

## 2023-04-22 ENCOUNTER — Ambulatory Visit: Payer: PPO | Admitting: Dermatology

## 2023-04-22 ENCOUNTER — Encounter: Payer: Self-pay | Admitting: Dermatology

## 2023-04-22 VITALS — BP 142/74 | HR 59

## 2023-04-22 DIAGNOSIS — L720 Epidermal cyst: Secondary | ICD-10-CM | POA: Diagnosis not present

## 2023-04-22 DIAGNOSIS — D239 Other benign neoplasm of skin, unspecified: Secondary | ICD-10-CM

## 2023-04-22 DIAGNOSIS — D1801 Hemangioma of skin and subcutaneous tissue: Secondary | ICD-10-CM | POA: Diagnosis not present

## 2023-04-22 DIAGNOSIS — Z1283 Encounter for screening for malignant neoplasm of skin: Secondary | ICD-10-CM

## 2023-04-22 DIAGNOSIS — L578 Other skin changes due to chronic exposure to nonionizing radiation: Secondary | ICD-10-CM | POA: Diagnosis not present

## 2023-04-22 DIAGNOSIS — D492 Neoplasm of unspecified behavior of bone, soft tissue, and skin: Secondary | ICD-10-CM | POA: Diagnosis not present

## 2023-04-22 DIAGNOSIS — L814 Other melanin hyperpigmentation: Secondary | ICD-10-CM | POA: Diagnosis not present

## 2023-04-22 DIAGNOSIS — Z85828 Personal history of other malignant neoplasm of skin: Secondary | ICD-10-CM

## 2023-04-22 DIAGNOSIS — D692 Other nonthrombocytopenic purpura: Secondary | ICD-10-CM

## 2023-04-22 DIAGNOSIS — L821 Other seborrheic keratosis: Secondary | ICD-10-CM

## 2023-04-22 DIAGNOSIS — W908XXA Exposure to other nonionizing radiation, initial encounter: Secondary | ICD-10-CM | POA: Diagnosis not present

## 2023-04-22 DIAGNOSIS — D2371 Other benign neoplasm of skin of right lower limb, including hip: Secondary | ICD-10-CM | POA: Diagnosis not present

## 2023-04-22 DIAGNOSIS — D229 Melanocytic nevi, unspecified: Secondary | ICD-10-CM

## 2023-04-22 NOTE — Progress Notes (Signed)
Follow-Up Visit   Subjective  Jorge Knox is a 67 y.o. male who presents for the following: Skin Cancer Screening and Full Body Skin Exam. Hx of BCCs. Wife concerned about brown spot on scalp.  C/O cyst on right posterior neck. Would like to have removed.   The patient presents for Total-Body Skin Exam (TBSE) for skin cancer screening and mole check. The patient has spots, moles and lesions to be evaluated, some may be new or changing and the patient may have concern these could be cancer.    The following portions of the chart were reviewed this encounter and updated as appropriate: medications, allergies, medical history  Review of Systems:  No other skin or systemic complaints except as noted in HPI or Assessment and Plan.  Objective  Well appearing patient in no apparent distress; mood and affect are within normal limits.  A full examination was performed including scalp, head, eyes, ears, nose, lips, neck, chest, axillae, abdomen, back, buttocks, bilateral upper extremities, bilateral lower extremities, hands, feet, fingers, toes, fingernails, and toenails. All findings within normal limits unless otherwise noted below.   Relevant physical exam findings are noted in the Assessment and Plan.  Upper Mid Back 9 mm pigmented macule            Assessment & Plan   HISTORY OF BASAL CELL CARCINOMA OF THE SKIN. Left parietal scalp, Mohs; left medial canthus, Mohs; 08/21/2017  - No evidence of recurrence today - Recommend regular full body skin exams - Recommend daily broad spectrum sunscreen SPF 30+ to sun-exposed areas, reapply every 2 hours as needed.  - Call if any new or changing lesions are noted between office visits   SKIN CANCER SCREENING PERFORMED TODAY.  ACTINIC DAMAGE - Chronic condition, secondary to cumulative UV/sun exposure - diffuse scaly erythematous macules with underlying dyspigmentation - Recommend daily broad spectrum sunscreen SPF 30+ to  sun-exposed areas, reapply every 2 hours as needed.  - Staying in the shade or wearing long sleeves, sun glasses (UVA+UVB protection) and wide brim hats (4-inch brim around the entire circumference of the hat) are also recommended for sun protection.  - Call for new or changing lesions.  LENTIGINES, SEBORRHEIC KERATOSES, HEMANGIOMAS - Benign normal skin lesions - Benign-appearing - Call for any changes  MELANOCYTIC NEVI - Tan-brown and/or pink-flesh-colored symmetric macules and papules - Benign appearing on exam today - Observation - Call clinic for new or changing moles - Recommend daily use of broad spectrum spf 30+ sunscreen to sun-exposed areas.    SEBORRHEIC KERATOSIS - Stuck-on, waxy, tan-brown plaque at vertex scalp  - Benign-appearing - Discussed benign etiology and prognosis. - Observe - Call for any changes   EPIDERMAL INCLUSION CYST Exam: Subcutaneous nodule at right upper central back  Benign-appearing. Exam most consistent with an epidermal inclusion cyst. Discussed that a cyst is a benign growth that can grow over time and sometimes get irritated or inflamed. Recommend observation if it is not bothersome. Discussed option of surgical excision to remove it if it is growing, symptomatic, or other changes noted. Please call for new or changing lesions so they can be evaluated.   Purpura - Chronic; persistent and recurrent.  Treatable, but not curable. - Violaceous macules and patches at arms. - Benign - Related to trauma, age, sun damage and/or use of blood thinners, chronic use of topical and/or oral steroids - Observe - Can use OTC arnica containing moisturizer such as Dermend Bruise Formula if desired - Call for  worsening or other concerns  DERMATOFIBROMA Exam: Firm pink/brown papulenodule with dimple sign at right lateral calf.  Treatment Plan: A dermatofibroma is a benign growth possibly related to trauma, such as an insect bite, cut from shaving, or  inflamed acne-type bump.  Treatment options to remove include shave or excision with resulting scar and risk of recurrence.  Since benign-appearing and not bothersome, will observe for now.    Neoplasm of skin Upper Mid Back  Skin / nail biopsy Type of biopsy: tangential   Informed consent: discussed and consent obtained   Timeout: patient name, date of birth, surgical site, and procedure verified   Procedure prep:  Patient was prepped and draped in usual sterile fashion Prep type:  Isopropyl alcohol Anesthesia: the lesion was anesthetized in a standard fashion   Anesthetic:  1% lidocaine w/ epinephrine 1-100,000 buffered w/ 8.4% NaHCO3 Instrument used: DermaBlade   Hemostasis achieved with: pressure and aluminum chloride   Outcome: patient tolerated procedure well   Post-procedure details: sterile dressing applied and wound care instructions given   Dressing type: bandage and petrolatum    Specimen 1 - Surgical pathology Differential Diagnosis: R/O Melanoma  Check Margins: No  Multiple benign nevi  Lentigines  Actinic elastosis  Seborrheic keratoses  Cherry angioma   Return in about 1 year (around 04/21/2024) for TBSE, HxBCC, Cyst Excision, Next Available.  I, Lawson Radar, CMA, am acting as scribe for Elie Goody, MD.   Documentation: I have reviewed the above documentation for accuracy and completeness, and I agree with the above.  Elie Goody, MD

## 2023-04-22 NOTE — Patient Instructions (Addendum)
Wound Care Instructions  Cleanse wound gently with soap and water once a day then pat dry with clean gauze. Apply a thin coat of Petrolatum (petroleum jelly, "Vaseline") over the wound (unless you have an allergy to this). We recommend that you use a new, sterile tube of Vaseline. Do not pick or remove scabs. Do not remove the yellow or white "healing tissue" from the base of the wound.  Cover the wound with fresh, clean, nonstick gauze and secure with paper tape. You may use Band-Aids in place of gauze and tape if the wound is small enough, but would recommend trimming much of the tape off as there is often too much. Sometimes Band-Aids can irritate the skin.  You should call the office for your biopsy report after 1 week if you have not already been contacted.  If you experience any problems, such as abnormal amounts of bleeding, swelling, significant bruising, significant pain, or evidence of infection, please call the office immediately.  FOR ADULT SURGERY PATIENTS: If you need something for pain relief you may take 1 extra strength Tylenol (acetaminophen) AND 2 Ibuprofen (200mg  each) together every 4 hours as needed for pain. (do not take these if you are allergic to them or if you have a reason you should not take them.) Typically, you may only need pain medication for 1 to 3 days.      Pre-Operative Instructions  You are scheduled for a surgical procedure at [facility]. We recommend you read the following instructions. If you have any questions or concerns, please call the office at [phone].  Shower and wash the entire body with soap and water the day of your surgery paying special attention to cleansing at and around the planned surgery site.  Avoid aspirin or aspirin containing products at least fourteen (14) days prior to your surgical procedure and for at least one week (7 Days) after your surgical procedure. If you take aspirin on a regular basis for heart disease or  history of stroke or for any other reason, we may recommend you continue taking aspirin but please notify us if you take this on a regular basis. Aspirin can cause more bleeding to occur during surgery as well as prolonged bleeding and bruising after surgery.   Avoid other nonsteroidal pain medications at least one week prior to surgery and at least one week prior to your surgery. These include medications such as Ibuprofen (Motrin, Advil and Nuprin), Naprosyn, Voltaren, Relafen, etc. If medications are used for therapeutic reasons, please inform us as they can cause increased bleeding or prolonged bleeding during and bruising after surgical procedures.   Please advise [place] if you are taking any "blood thinner" medications such as Coumadin or Dipyridamole or Plavix or similar medications. These cause increased bleeding and prolonged bleeding during procedures and bruising after surgical procedures. We may have to consider discontinuing these medications briefly prior to and shortly after your surgery if safe to do so.   Please inform us of all medications you are currently taking. All medications that are taken regularly should be taken the day of surgery as you always do. Nevertheless, we need to be informed of what medications you are taking prior to surgery to know whether they will affect the procedure or cause any complications.   Please inform us of any medication allergies. Also inform us of whether you have allergies to Latex or rubber products or whether you have had any adverse reaction to Lidocaine or Epinephrine.  Please  inform us of any prosthetic or artificial body parts such as artificial heart valve, joint replacements, etc., or similar condition that might require preoperative antibiotics.   We recommend avoidance of alcohol at least two weeks prior to surgery and continued avoidance for at least two weeks after surgery.   We recommend discontinuation of tobacco smoking at least two  weeks prior to surgery and continued abstinence for at least two weeks after surgery.  Do not plan strenuous exercise, strenuous work or strenuous lifting for approximately four weeks after your surgery.   We request if you are unable to make your scheduled surgical appointment, please call us at least a week in advance or as soon as you are aware of a problem so that we can cancel or reschedule the appointment.   You MAY TAKE TYLENOL (acetaminophen) for pain as it is not a blood thinner.   PLEASE PLAN TO BE IN TOWN FOR TWO WEEKS FOLLOWING SURGERY, THIS IS IMPORTANT SO YOU CAN BE CHECKED FOR DRESSING CHANGES, SUTURE REMOVAL AND TO MONITOR FOR POSSIBLE COMPLICATIONS.     Recommend daily broad spectrum sunscreen SPF 30+ to sun-exposed areas, reapply every 2 hours as needed. Call for new or changing lesions.  Staying in the shade or wearing long sleeves, sun glasses (UVA+UVB protection) and wide brim hats (4-inch brim around the entire circumference of the hat) are also recommended for sun protection.     Seborrheic Keratosis (Brown area on scalp)  What causes seborrheic keratoses? Seborrheic keratoses are harmless, common skin growths that first appear during adult life.  As time goes by, more growths appear.  Some people may develop a large number of them.  Seborrheic keratoses appear on both covered and uncovered body parts.  They are not caused by sunlight.  The tendency to develop seborrheic keratoses can be inherited.  They vary in color from skin-colored to gray, brown, or even black.  They can be either smooth or have a rough, warty surface.   Seborrheic keratoses are superficial and look as if they were stuck on the skin.  Under the microscope this type of keratosis looks like layers upon layers of skin.  That is why at times the top layer may seem to fall off, but the rest of the growth remains and re-grows.    Treatment Seborrheic keratoses do not need to be treated, but can easily be  removed in the office.  Seborrheic keratoses often cause symptoms when they rub on clothing or jewelry.  Lesions can be in the way of shaving.  If they become inflamed, they can cause itching, soreness, or burning.  Removal of a seborrheic keratosis can be accomplished by freezing, burning, or surgery. If any spot bleeds, scabs, or grows rapidly, please return to have it checked, as these can be an indication of a skin cancer.     Melanoma ABCDEs  Melanoma is the most dangerous type of skin cancer, and is the leading cause of death from skin disease.  You are more likely to develop melanoma if you: Have light-colored skin, light-colored eyes, or red or blond hair Spend a lot of time in the sun Tan regularly, either outdoors or in a tanning bed Have had blistering sunburns, especially during childhood Have a close family member who has had a melanoma Have atypical moles or large birthmarks  Early detection of melanoma is key since treatment is typically straightforward and cure rates are extremely high if we catch it early.   The first  sign of melanoma is often a change in a mole or a new dark spot.  The ABCDE system is a way of remembering the signs of melanoma.  A for asymmetry:  The two halves do not match. B for border:  The edges of the growth are irregular. C for color:  A mixture of colors are present instead of an even brown color. D for diameter:  Melanomas are usually (but not always) greater than 6mm - the size of a pencil eraser. E for evolution:  The spot keeps changing in size, shape, and color.  Please check your skin once per month between visits. You can use a small mirror in front and a large mirror behind you to keep an eye on the back side or your body.   If you see any new or changing lesions before your next follow-up, please call to schedule a visit.  Please continue daily skin protection including broad spectrum sunscreen SPF 30+ to sun-exposed areas, reapplying  every 2 hours as needed when you're outdoors.   Staying in the shade or wearing long sleeves, sun glasses (UVA+UVB protection) and wide brim hats (4-inch brim around the entire circumference of the hat) are also recommended for sun protection.     Due to recent changes in healthcare laws, you may see results of your pathology and/or laboratory studies on MyChart before the doctors have had a chance to review them. We understand that in some cases there may be results that are confusing or concerning to you. Please understand that not all results are received at the same time and often the doctors may need to interpret multiple results in order to provide you with the best plan of care or course of treatment. Therefore, we ask that you please give [place] 2 business days to thoroughly review all your results before contacting the office for clarification. Should we see a critical lab result, you will be contacted sooner.   If You Need Anything After Your Visit  If you have any questions or concerns for your doctor, please call our main line at [phone] and press option 4 to reach your doctor's medical assistant. If no one answers, please leave a voicemail as directed and we will return your call as soon as possible. Messages left after 4 pm will be answered the following business day.   You may also send [place] a message via MyChart. We typically respond to MyChart messages within 1-2 business days.  For prescription refills, please ask your pharmacy to contact our office. Our fax number is [phone].  If you have an urgent issue when the clinic is closed that cannot wait until the next business day, you can page your doctor at the number below.    Please note that while we do our best to be available for urgent issues outside of office hours, we are not available 24/7.   If you have an urgent issue and are unable to reach [place], you may choose to seek medical care at your doctor's office, retail  clinic, urgent care center, or emergency room.  If you have a medical emergency, please immediately call 911 or go to the emergency department.  Pager Numbers  - Dr. Gwen Pounds: [phone]  - Dr. Roseanne Reno: [phone]  - Dr. Katrinka Blazing: [phone]   In the event of inclement weather, please call our main line at [phone] for an update on the status of any delays or closures.  Dermatology Medication Tips: Please keep the boxes that  topical medications come in in order to help keep track of the instructions about where and how to use these. Pharmacies typically print the medication instructions only on the boxes and not directly on the medication tubes.   If your medication is too expensive, please contact our office at [phone] option 4 or send [place] a message through MyChart.   We are unable to tell what your co-pay for medications will be in advance as this is different depending on your insurance coverage. However, we may be able to find a substitute medication at lower cost or fill out paperwork to get insurance to cover a needed medication.   If a prior authorization is required to get your medication covered by your insurance company, please allow [place] 1-2 business days to complete this process.  Drug prices often vary depending on where the prescription is filled and some pharmacies may offer cheaper prices.  The website www.goodrx.com contains coupons for medications through different pharmacies. The prices here do not account for what the cost may be with help from insurance (it may be cheaper with your insurance), but the website can give you the price if you did not use any insurance.  - You can print the associated coupon and take it with your prescription to the pharmacy.  - You may also stop by our office during regular business hours and pick up a GoodRx coupon card.  - If you need your prescription sent electronically to a different pharmacy, notify our office through [facility] or by phone at [phone] option 4.     Si Usted Necesita Algo Despus de Su Visita  Tambin puede enviarnos un mensaje a travs de Clinical cytogeneticist. Por lo general respondemos a los mensajes de MyChart en el transcurso de 1 a 2 das hbiles.  Para renovar recetas, por favor pida a su farmacia que se ponga en contacto con nuestra oficina. Annie Sable de fax es [place] [phone].  Si tiene un asunto urgente cuando la clnica est cerrada y que no puede esperar hasta el siguiente da hbil, puede llamar/localizar a su doctor(a) al nmero que aparece a continuacin.   Por favor, tenga en cuenta que aunque hacemos todo lo posible para estar disponibles para asuntos urgentes fuera del horario de [place], no estamos disponibles las 24 horas del da, los 7 [address]  [address] de la [place].   Si tiene un problema urgente y no puede comunicarse con nosotros, puede optar por buscar atencin mdica  en el consultorio de su doctor(a), en una clnica privada, en un centro de atencin urgente o en una sala de emergencias.  Si tiene Engineer, drilling, por favor llame inmediatamente al 911 o vaya a la sala de emergencias.  Nmeros de bper  - Dr. Gwen Pounds: [phone]  - Dra. Roseanne Reno: [phone]  - Dr. Katrinka Blazing: [phone]   En caso de inclemencias del tiempo, por favor llame a Lacy Duverney principal al [phone] para una actualizacin sobre el [place] de cualquier retraso o cierre.  Consejos para la medicacin en dermatologa: Por favor, guarde las cajas en las que vienen los medicamentos de uso tpico para ayudarle a seguir las instrucciones sobre dnde y cmo usarlos. Las farmacias generalmente imprimen las instrucciones del medicamento slo en las cajas y no directamente en los tubos del [place].   Si su medicamento es muy caro, por favor, pngase en contacto con Rolm Gala llamando al [phone] y presione la opcin 4 o envenos un mensaje a travs de Clinical cytogeneticist.   No  podemos  decirle cul ser su copago por los medicamentos por adelantado ya que esto es diferente dependiendo de la cobertura de su seguro. Sin embargo, es posible que podamos encontrar un medicamento sustituto a Audiological scientist un formulario para que el seguro cubra el medicamento que se considera necesario.   Si se requiere una autorizacin previa para que su compaa de seguros [place] su medicamento, por favor permtanos de 1 a 2 das hbiles para completar [address]  Los precios de los medicamentos varan con frecuencia dependiendo del Environmental consultant de dnde se surte la receta y alguna farmacias pueden ofrecer precios ms baratos.  El sitio web www.goodrx.com tiene cupones para medicamentos de Health and safety inspector. Los precios aqu no tienen en cuenta lo que podra costar con la ayuda del seguro (puede ser ms barato con su seguro), pero el sitio web puede darle el precio si no utiliz Tourist information centre manager.  - Puede imprimir el cupn correspondiente y llevarlo con su receta a la farmacia.  - Tambin puede pasar por nuestra oficina durante el horario de atencin regular y Education officer, museum una tarjeta de cupones de GoodRx.  - Si necesita que su receta se enve electrnicamente a una farmacia diferente, informe a nuestra oficina a travs de MyChart de [place] o por telfono llamando al [phone] y presione la opcin 4.

## 2023-04-23 ENCOUNTER — Encounter: Payer: PRIVATE HEALTH INSURANCE | Admitting: Dermatology

## 2023-04-25 LAB — SURGICAL PATHOLOGY

## 2023-04-29 ENCOUNTER — Encounter: Payer: Self-pay | Admitting: Family Medicine

## 2023-04-29 ENCOUNTER — Ambulatory Visit: Payer: PPO | Admitting: Family Medicine

## 2023-04-29 VITALS — BP 107/58 | HR 62 | Temp 97.6°F | Ht 71.0 in | Wt 248.8 lb

## 2023-04-29 DIAGNOSIS — I10 Essential (primary) hypertension: Secondary | ICD-10-CM

## 2023-04-29 DIAGNOSIS — Z0000 Encounter for general adult medical examination without abnormal findings: Secondary | ICD-10-CM

## 2023-04-29 DIAGNOSIS — Z23 Encounter for immunization: Secondary | ICD-10-CM

## 2023-04-29 DIAGNOSIS — R7303 Prediabetes: Secondary | ICD-10-CM | POA: Diagnosis not present

## 2023-04-29 DIAGNOSIS — Z0001 Encounter for general adult medical examination with abnormal findings: Secondary | ICD-10-CM

## 2023-04-29 DIAGNOSIS — Z125 Encounter for screening for malignant neoplasm of prostate: Secondary | ICD-10-CM | POA: Diagnosis not present

## 2023-04-29 DIAGNOSIS — E782 Mixed hyperlipidemia: Secondary | ICD-10-CM | POA: Diagnosis not present

## 2023-04-29 DIAGNOSIS — R739 Hyperglycemia, unspecified: Secondary | ICD-10-CM

## 2023-04-29 NOTE — Progress Notes (Signed)
Complete physical exam  Patient: Jorge Knox   DOB: 03/14/1956   67 y.o. Male  MRN: [id number] Visit Date: 04/29/2023  Today's healthcare provider: Jacky Kindle, FNP  Re-introduced to nurse practitioner role and practice setting.  All questions answered.  Discussed provider/patient relationship and expectations.  Chief Complaint  Patient presents with   Medical Management of Chronic Issues    4 week follow up on HTN, no other concerns today    Subjective    Jorge Knox is a 67 y.o. male who presents today for a complete physical exam.  He reports consuming a general diet. The patient has a physically strenuous job, but has no regular exercise apart from work.  He generally feels fairly well. He reports sleeping fairly well. He does have additional problems to discuss today.   HPI HPI     Medical Management of Chronic Issues    Additional comments: 4 week follow up on HTN, no other concerns today       Last edited by Rolly Salter, CMA on 04/29/2023  8:34 AM.      Past Medical History:  Diagnosis Date   BCC (basal cell carcinoma) 08/21/2017   left parietal scalp, Mohs   BCC (basal cell carcinoma) 08/21/2017   left medial canthus, Mohs   H/O seasonal allergies    Hyperlipidemia    Past Surgical History:  Procedure Laterality Date   COLONOSCOPY WITH PROPOFOL N/A 09/08/2020   Procedure: COLONOSCOPY WITH PROPOFOL;  Surgeon: Pasty Spillers, MD;  Location: [facility] ENDOSCOPY;  Service: Endoscopy;  Laterality: N/A;  COVID POSITIVE 08/09/2020   WRIST SURGERY Right    pinning   Social History   Socioeconomic History   Marital status: Married    Spouse name: Not on file   Number of children: Not on file   Years of education: Not on file   Highest education level: Not on file  Occupational History   Not on file  Tobacco Use   Smoking status: Never   Smokeless tobacco: Never  Substance and Sexual Activity   Alcohol use: Not Currently    Alcohol/week:  0.0 standard drinks of alcohol   Drug use: Never   Sexual activity: Not on file  Other Topics Concern   Not on file  Social History Narrative   Not on file   Social Determinants of Health   Financial Resource Strain: Not on file  Food Insecurity: Not on file  Transportation Needs: Not on file  Physical Activity: Not on file  Stress: Not on file  Social Connections: Not on file  Intimate Partner Violence: Not on file   No family status information on file.   History reviewed. No pertinent family history. Allergies  Allergen Reactions   Cefdinir Nausea Only    Patient Care Team: Jacky Kindle, FNP as PCP - General (Family Medicine)   Medications: Outpatient Medications Prior to Visit  Medication Sig   atorvastatin (LIPITOR) 20 MG tablet TAKE 1 TABLET BY MOUTH EVERY DAY   lisinopril (ZESTRIL) 20 MG tablet Take 1 tablet (20 mg total) by mouth daily.   [DISCONTINUED] azithromycin (ZITHROMAX) 500 MG tablet Take 1 tablet (500 mg total) by mouth daily. (Patient not taking: Reported on 04/29/2023)   [DISCONTINUED] chlorpheniramine-HYDROcodone (TUSSIONEX) 10-8 MG/5ML Take 5 mLs by mouth every 12 (twelve) hours as needed for cough. (Patient not taking: Reported on 04/29/2023)   No facility-administered medications prior to visit.    Review of Systems  Objective    BP (!) 107/58 Comment: 10/14  Pulse 62   Temp 97.6 F (36.4 C) (Oral)   Ht 5\' 11"  (1.803 m)   Wt 248 lb 12.8 oz (112.9 kg)   SpO2 99%   BMI 34.70 kg/m    Physical Exam Vitals and nursing note reviewed.  Constitutional:      General: He is awake. He is not in acute distress.    Appearance: Normal appearance. He is well-developed and well-groomed. He is obese. He is not ill-appearing, toxic-appearing or diaphoretic.  HENT:     Head: Normocephalic and atraumatic.     Jaw: There is normal jaw occlusion. No trismus, tenderness, swelling or pain on movement.     Salivary Glands: Right salivary gland is not  diffusely enlarged or tender. Left salivary gland is not diffusely enlarged or tender.     Right Ear: Hearing and external ear normal.     Left Ear: Hearing and external ear normal.     Nose: Nose normal. No congestion or rhinorrhea.     Right Turbinates: Not enlarged, swollen or pale.     Left Turbinates: Not enlarged, swollen or pale.     Right Sinus: No maxillary sinus tenderness or frontal sinus tenderness.     Left Sinus: No maxillary sinus tenderness or frontal sinus tenderness.     Mouth/Throat:     Lips: Pink.     Mouth: Mucous membranes are moist. No injury, lacerations, oral lesions or angioedema.     Pharynx: Oropharynx is clear. Uvula midline. No pharyngeal swelling, oropharyngeal exudate or posterior oropharyngeal erythema.     Tonsils: No tonsillar exudate or tonsillar abscesses.  Eyes:     General: Lids are normal. Vision grossly intact. Gaze aligned appropriately.        Right eye: No discharge.        Left eye: No discharge.     Conjunctiva/sclera: Conjunctivae normal.  Neck:     Thyroid: No thyroid mass, thyromegaly or thyroid tenderness.     Vascular: No carotid bruit.     Trachea: Trachea normal. No tracheal tenderness.  Cardiovascular:     Rate and Rhythm: Normal rate and regular rhythm.     Pulses: Normal pulses.          Carotid pulses are 2+ on the right side and 2+ on the left side.      Radial pulses are 2+ on the right side and 2+ on the left side.       Femoral pulses are 2+ on the right side and 2+ on the left side.      Popliteal pulses are 2+ on the right side and 2+ on the left side.       Dorsalis pedis pulses are 2+ on the right side and 2+ on the left side.       Posterior tibial pulses are 2+ on the right side and 2+ on the left side.     Heart sounds: Normal heart sounds, S1 normal and S2 normal. No murmur heard.    No friction rub. No gallop.  Pulmonary:     Effort: Pulmonary effort is normal. No respiratory distress.     Breath sounds: Normal  breath sounds and air entry. No stridor. No wheezing, rhonchi or rales.  Chest:     Chest wall: No tenderness.  Abdominal:     General: Bowel sounds are normal.     Palpations: Abdomen is soft.  Genitourinary:  Comments: Exam deferred; denies complaints Musculoskeletal:        General: No swelling, tenderness, deformity or signs of injury. Normal range of motion.     Cervical back: Normal range of motion and neck supple. No rigidity or tenderness.     Right lower leg: No edema.     Left lower leg: No edema.  Lymphadenopathy:     Cervical: No cervical adenopathy.     Right cervical: No superficial, deep or posterior cervical adenopathy.    Left cervical: No superficial, deep or posterior cervical adenopathy.  Skin:    General: Skin is warm and dry.     Capillary Refill: Capillary refill takes less than 2 seconds.     Coloration: Skin is not jaundiced or pale.     Findings: No bruising, erythema, lesion or rash.  Neurological:     General: No focal deficit present.     Mental Status: He is alert and oriented to person, place, and time. Mental status is at baseline.     GCS: GCS eye subscore is 4. GCS verbal subscore is 5. GCS motor subscore is 6.     Sensory: Sensation is intact. No sensory deficit.     Motor: Motor function is intact. No weakness.     Coordination: Coordination is intact.     Gait: Gait is intact.  Psychiatric:        Attention and Perception: Attention and perception normal.        Mood and Affect: Mood and affect normal.        Speech: Speech normal.        Behavior: Behavior normal. Behavior is cooperative.        Thought Content: Thought content normal.        Cognition and Memory: Cognition normal.        Judgment: Judgment normal.     Last depression screening scores    04/29/2023    8:39 AM 03/27/2023    8:44 AM 10/17/2020    3:08 PM  PHQ 2/9 Scores  PHQ - 2 Score 0 0 0  PHQ- 9 Score 0 0 0   Last fall risk screening    04/29/2023    9:37 AM   Fall Risk   Falls in the past year? 0  Number falls in past yr: 0  Injury with Fall? 0   Last Audit-C alcohol use screening    10/17/2020    3:09 PM  Alcohol Use Disorder Test (AUDIT)  1. How often do you have a drink containing alcohol? 0  2. How many drinks containing alcohol do you have on a typical day when you are drinking? 0  3. How often do you have six or more drinks on one occasion? 0  AUDIT-C Score 0   A score of 3 or more in women, and 4 or more in men indicates increased risk for alcohol abuse, EXCEPT if all of the points are from question 1   No results found for any visits on 04/29/23.  Assessment & Plan    Routine Health Maintenance and Physical Exam  Exercise Activities and Dietary recommendations  Goals   None     Immunization History  Administered Date(s) Administered   Fluad Trivalent(High Dose 65+) 04/29/2023   Influenza Inj Mdck Quad With Preservative 06/08/2018   Influenza,inj,Quad PF,6+ Mos 05/04/2015, 05/04/2020   Tdap 04/19/2011    Health Maintenance  Topic Date Due   Medicare Annual Wellness (AWV)  Never done  COVID-19 Vaccine (1) Never done   Zoster Vaccines- Shingrix (1 of 2) Never done   DTaP/Tdap/Td (2 - Td or Tdap) 04/18/2021   Pneumonia Vaccine 26+ Years old (1 of 1 - PCV) Never done   Colonoscopy  09/09/2023   INFLUENZA VACCINE  Completed   Hepatitis C Screening  Completed   HIV Screening  Completed   HPV VACCINES  Aged Out    Discussed health benefits of physical activity, and encouraged him to engage in regular exercise appropriate for his age and condition.  Problem List Items Addressed This Visit       Cardiovascular and Mediastinum   Primary hypertension     Other   Annual physical exam - Primary   Relevant Orders   Comprehensive Metabolic Panel (CMET)   CBC with Differential/Platelet   TSH   Lipid panel   Borderline diabetes   Relevant Orders   Hemoglobin A1c   Elevated serum glucose   Encounter for screening  prostate specific antigen (PSA) measurement   Relevant Orders   PSA   Flu vaccine need   Relevant Orders   Flu Vaccine Trivalent High Dose (Fluad) (Completed)   Hyperlipidemia   Hypertension Recent episodes of low blood pressure readings at home (85-107/50s) with associated symptoms, but blood pressure readings in the office have been consistently elevated (140s/80s). Patient is currently on Lisinopril 20mg  daily. -Reduce Lisinopril to 10mg  daily by cutting current tablets in half if symptoms are consistent or blood pressure continues to run <110 systolic. -Continue home blood pressure monitoring and report readings to the office. -Order labs to assess renal function and electrolytes.  General Health Maintenance -Declined pneumonia and shingles vaccines. -Continue annual influenza vaccination.  Return in about 1 year (around 04/28/2024), or if symptoms worsen or fail to improve, for annual examination.     Leilani Merl, FNP, have reviewed all documentation for this visit. The documentation on 04/29/23 for the exam, diagnosis, procedures, and orders are all accurate and complete.  Jacky Kindle, FNP  [facility] [phone] (phone) [phone] (fax)  [facility]

## 2023-04-30 LAB — LIPID PANEL
Chol/HDL Ratio: 4.2 {ratio} (ref 0.0–5.0)
Cholesterol, Total: 125 mg/dL (ref 100–199)
HDL: 30 mg/dL — ABNORMAL LOW (ref >39)
LDL Chol Calc (NIH): 79 mg/dL (ref 0–99)
Triglycerides: 82 mg/dL (ref 0–149)
VLDL Cholesterol Cal: 16 mg/dL (ref 5–40)

## 2023-04-30 LAB — CBC WITH DIFFERENTIAL/PLATELET
Basophils Absolute: 0 10*3/uL (ref 0.0–0.2)
Basos: 1 %
EOS (ABSOLUTE): 0.2 10*3/uL (ref 0.0–0.4)
Eos: 4 %
Hematocrit: 49.6 % (ref 37.5–51.0)
Hemoglobin: 15.9 g/dL (ref 13.0–17.7)
Immature Grans (Abs): 0 10*3/uL (ref 0.0–0.1)
Immature Granulocytes: 0 %
Lymphocytes Absolute: 1.5 10*3/uL (ref 0.7–3.1)
Lymphs: 27 %
MCH: 30.2 pg (ref 26.6–33.0)
MCHC: 32.1 g/dL (ref 31.5–35.7)
MCV: 94 fL (ref 79–97)
Monocytes Absolute: 0.5 10*3/uL (ref 0.1–0.9)
Monocytes: 9 %
Neutrophils Absolute: 3.3 10*3/uL (ref 1.4–7.0)
Neutrophils: 59 %
Platelets: 219 10*3/uL (ref 150–450)
RBC: 5.27 x10E6/uL (ref 4.14–5.80)
RDW: 12.1 % (ref 11.6–15.4)
WBC: 5.5 10*3/uL (ref 3.4–10.8)

## 2023-04-30 LAB — COMPREHENSIVE METABOLIC PANEL
ALT: 31 [IU]/L (ref 0–44)
AST: 20 [IU]/L (ref 0–40)
Albumin: 4.3 g/dL (ref 3.9–4.9)
Alkaline Phosphatase: 78 [IU]/L (ref 44–121)
BUN/Creatinine Ratio: 14 (ref 10–24)
BUN: 15 mg/dL (ref 8–27)
Bilirubin Total: 1 mg/dL (ref 0.0–1.2)
CO2: 24 mmol/L (ref 20–29)
Calcium: 9.8 mg/dL (ref 8.6–10.2)
Chloride: 104 mmol/L (ref 96–106)
Creatinine, Ser: 1.09 mg/dL (ref 0.76–1.27)
Globulin, Total: 2.2 g/dL (ref 1.5–4.5)
Glucose: 114 mg/dL — ABNORMAL HIGH (ref 70–99)
Potassium: 4.9 mmol/L (ref 3.5–5.2)
Sodium: 142 mmol/L (ref 134–144)
Total Protein: 6.5 g/dL (ref 6.0–8.5)
eGFR: 75 mL/min/{1.73_m2} (ref >59)

## 2023-04-30 LAB — HEMOGLOBIN A1C
Est. average glucose Bld gHb Est-mCnc: 114 mg/dL
Hgb A1c MFr Bld: 5.6 % (ref 4.8–5.6)

## 2023-04-30 LAB — PSA: Prostate Specific Ag, Serum: 2 ng/mL (ref 0.0–4.0)

## 2023-04-30 LAB — TSH: TSH: 2.94 u[IU]/mL (ref 0.450–4.500)

## 2023-05-19 ENCOUNTER — Telehealth: Payer: Self-pay

## 2023-05-19 NOTE — Telephone Encounter (Signed)
Copied from CRM [id number]. Topic: Referral - Request for Referral >> May 16, 2023  2:56 PM Phill Myron wrote: 1. PT Stefanik would like a referral for [facility]  for a Colonoscopy..    2. Mr Jorge Knox BP is doing fine he wanted Provider Suzie Portela to know.  120/72

## 2023-05-20 ENCOUNTER — Other Ambulatory Visit: Payer: Self-pay | Admitting: Family Medicine

## 2023-05-20 ENCOUNTER — Encounter: Payer: Self-pay | Admitting: Family Medicine

## 2023-05-20 ENCOUNTER — Ambulatory Visit: Payer: PPO

## 2023-05-20 DIAGNOSIS — Z0000 Encounter for general adult medical examination without abnormal findings: Secondary | ICD-10-CM | POA: Diagnosis not present

## 2023-05-20 DIAGNOSIS — Z1211 Encounter for screening for malignant neoplasm of colon: Secondary | ICD-10-CM

## 2023-05-20 NOTE — Telephone Encounter (Signed)
Patient is going to send a mychart message with BP readings.

## 2023-05-20 NOTE — Patient Instructions (Addendum)
Jorge Knox , Thank you for taking time to come for your Medicare Wellness Visit. I appreciate your ongoing commitment to your health goals. Please review the following plan we discussed and let me know if I can assist you in the future.   Referrals/Orders/Follow-Ups/Clinician Recommendations: none  This is a list of the screening recommended for you and due dates:  Health Maintenance  Topic Date Due   COVID-19 Vaccine (1) Never done   Zoster (Shingles) Vaccine (1 of 2) Never done   DTaP/Tdap/Td vaccine (2 - Td or Tdap) 04/18/2021   Pneumonia Vaccine (1 of 1 - PCV) Never done   Colon Cancer Screening  09/09/2023   Medicare Annual Wellness Visit  05/19/2024   Flu Shot  Completed   Hepatitis C Screening  Completed   HIV Screening  Completed   HPV Vaccine  Aged Out    Advanced directives: (ACP Link)Information on Advanced Care Planning can be found at [facility] [place] Advance Health Care Directives Advance Health Care Directives ([email or web address]   Next Medicare Annual Wellness Visit scheduled for next year: Yes  05/25/24 @ 1:35 pm by video

## 2023-05-20 NOTE — Progress Notes (Signed)
Subjective:   Jorge Knox is a 67 y.o. male who presents for an Initial Medicare Annual Wellness Visit.  Visit Complete: Virtual I connected with  Jorge Knox on 05/20/23 by a audio enabled telemedicine application and verified that I am speaking with the correct person using two identifiers.  Patient Location: Home  Provider Location: Office/Clinic  I discussed the limitations of evaluation and management by telemedicine. The patient expressed understanding and agreed to proceed.  Vital Signs: Because this visit was a virtual/telehealth visit, some criteria may be missing or patient reported. Any vitals not documented were not able to be obtained and vitals that have been documented are patient reported.  Cardiac Risk Factors include: advanced age (>69men, >84 women);dyslipidemia;male gender;hypertension;obesity (BMI >30kg/m2)     Objective:    There were no vitals filed for this visit. There is no height or weight on file to calculate BMI.     05/20/2023    1:33 PM 09/08/2020   10:10 AM  Advanced Directives  Does Patient Have a Medical Advance Directive? No No  Would patient like information on creating a medical advance directive? No - Patient declined No - Patient declined    Current Medications (verified) Outpatient Encounter Medications as of 05/20/2023  Medication Sig   atorvastatin (LIPITOR) 20 MG tablet TAKE 1 TABLET BY MOUTH EVERY DAY   lisinopril (ZESTRIL) 20 MG tablet Take 1 tablet (20 mg total) by mouth daily.   No facility-administered encounter medications on file as of 05/20/2023.    Allergies (verified) Cefdinir   History: Past Medical History:  Diagnosis Date   BCC (basal cell carcinoma) 08/21/2017   left parietal scalp, Mohs   BCC (basal cell carcinoma) 08/21/2017   left medial canthus, Mohs   H/O seasonal allergies    Hyperlipidemia    Past Surgical History:  Procedure Laterality Date   COLONOSCOPY WITH PROPOFOL N/A 09/08/2020    Procedure: COLONOSCOPY WITH PROPOFOL;  Surgeon: Pasty Spillers, MD;  Location: [facility] ENDOSCOPY;  Service: Endoscopy;  Laterality: N/A;  COVID POSITIVE 08/09/2020   WRIST SURGERY Right    pinning   History reviewed. No pertinent family history. Social History   Socioeconomic History   Marital status: Married    Spouse name: Not on file   Number of children: Not on file   Years of education: Not on file   Highest education level: Not on file  Occupational History   Not on file  Tobacco Use   Smoking status: Never   Smokeless tobacco: Never  Substance and Sexual Activity   Alcohol use: Not Currently    Alcohol/week: 0.0 standard drinks of alcohol   Drug use: Never   Sexual activity: Not on file  Other Topics Concern   Not on file  Social History Narrative   Not on file   Social Determinants of Health   Financial Resource Strain: Low Risk  (05/20/2023)   Overall Financial Resource Strain (CARDIA)    Difficulty of Paying Living Expenses: Not hard at all  Food Insecurity: No Food Insecurity (05/20/2023)   Hunger Vital Sign    Worried About Running Out of Food in the Last Year: Never true    Ran Out of Food in the Last Year: Never true  Transportation Needs: No Transportation Needs (05/20/2023)   PRAPARE - Administrator, Civil Service (Medical): No    Lack of Transportation (Non-Medical): No  Physical Activity: Sufficiently Active (05/20/2023)   Exercise Vital Sign  Days of Exercise per Week: 5 days    Minutes of Exercise per Session: 60 min  Stress: No Stress Concern Present (05/20/2023)   [employer] of Occupational Health - Occupational Stress Questionnaire    Feeling of Stress : Not at all  Social Connections: Moderately Integrated (05/20/2023)   Social Connection and Isolation Panel [NHANES]    Frequency of Communication with Friends and Family: More than three times a week    Frequency of Social Gatherings with Friends and Family: Twice a week     Attends Religious Services: More than 4 times per year    Active Member of [employer] or Organizations: No    Attends Engineer, structural: Never    Marital Status: Married    Tobacco Counseling Counseling given: Not Answered   Clinical Intake:  Pre-visit preparation completed: Yes  Pain : No/denies pain     Nutritional Status: BMI > 30  Obese Nutritional Risks: None Diabetes: No  How often do you need to have someone help you when you read instructions, pamphlets, or other written materials from your doctor or pharmacy?: 1 - Never  Interpreter Needed?: No  Information entered by :: Kennedy Bucker, LPN   Activities of Daily Living    05/20/2023    1:34 PM  In your present state of health, do you have any difficulty performing the following activities:  Hearing? 0  Vision? 0  Difficulty concentrating or making decisions? 0  Walking or climbing stairs? 0  Dressing or bathing? 0  Doing errands, shopping? 0  Preparing Food and eating ? N  Using the Toilet? N  In the past six months, have you accidently leaked urine? N  Do you have problems with loss of bowel control? N  Managing your Medications? N  Managing your Finances? N  Housekeeping or managing your Housekeeping? N    Patient Care Team: Jacky Kindle, FNP as PCP - General (Family Medicine)  Indicate any recent Medical Services you may have received from other than [facility] providers in the past year (date may be approximate).     Assessment:   This is a routine wellness examination for Jorge Knox.  Hearing/Vision screen Hearing Screening - Comments:: No aids Vision Screening - Comments:: No glasses   Goals Addressed             This Visit's Progress    DIET - EAT MORE FRUITS AND VEGETABLES         Depression Screen    05/20/2023    1:32 PM 04/29/2023    8:39 AM 03/27/2023    8:44 AM 10/17/2020    3:08 PM 05/04/2020    3:35 PM 09/04/2018    2:31 PM  PHQ 2/9 Scores  PHQ - 2 Score 0 0 0 0 0 0   PHQ- 9 Score 0 0 0 0  0    Fall Risk    05/20/2023    1:34 PM 04/29/2023    9:37 AM 10/17/2020    3:08 PM 05/04/2020    3:35 PM  Fall Risk   Falls in the past year? 0 0 0 0  Number falls in past yr: 0 0 0 0  Injury with Fall? 0 0 0 0  Risk for fall due to : No Fall Risks     Follow up Falls prevention discussed;Falls evaluation completed  Falls evaluation completed     MEDICARE RISK AT HOME: Medicare Risk at Home Any stairs in or around the home?: Yes  If so, are there any without handrails?: No Home free of loose throw rugs in walkways, pet beds, electrical cords, etc?: Yes Adequate lighting in your home to reduce risk of falls?: Yes Life alert?: No Use of a cane, walker or w/c?: No Grab bars in the bathroom?: No Shower chair or bench in shower?: Yes Elevated toilet seat or a handicapped toilet?: No  TIMED UP AND GO:  Was the test performed? No    Cognitive Function:        05/20/2023    1:35 PM  6CIT Screen  What Year? 0 points  What month? 0 points  What time? 0 points  Count back from 20 0 points  Months in reverse 0 points  Repeat phrase 0 points  Total Score 0 points    Immunizations Immunization History  Administered Date(s) Administered   Fluad Trivalent(High Dose 65+) 04/29/2023   Influenza Inj Mdck Quad With Preservative 06/08/2018   Influenza,inj,Quad PF,6+ Mos 05/04/2015, 05/04/2020   Tdap 04/19/2011    TDAP status: Due, Education has been provided regarding the importance of this vaccine. Advised may receive this vaccine at local pharmacy or Health Dept. Aware to provide a copy of the vaccination record if obtained from local pharmacy or Health Dept. Verbalized acceptance and understanding.  Flu Vaccine status: Up to date  Pneumococcal vaccine status: Declined,  Education has been provided regarding the importance of this vaccine but patient still declined. Advised may receive this vaccine at local pharmacy or Health Dept. Aware to provide a  copy of the vaccination record if obtained from local pharmacy or Health Dept. Verbalized acceptance and understanding.   Covid-19 vaccine status: Declined, Education has been provided regarding the importance of this vaccine but patient still declined. Advised may receive this vaccine at local pharmacy or Health Dept.or vaccine clinic. Aware to provide a copy of the vaccination record if obtained from local pharmacy or Health Dept. Verbalized acceptance and understanding.  Qualifies for Shingles Vaccine? Yes   Zostavax completed Yes   Shingrix Completed?: No.    Education has been provided regarding the importance of this vaccine. Patient has been advised to call insurance company to determine out of pocket expense if they have not yet received this vaccine. Advised may also receive vaccine at local pharmacy or Health Dept. Verbalized acceptance and understanding.  Screening Tests Health Maintenance  Topic Date Due   COVID-19 Vaccine (1) Never done   Zoster Vaccines- Shingrix (1 of 2) Never done   DTaP/Tdap/Td (2 - Td or Tdap) 04/18/2021   Pneumonia Vaccine 33+ Years old (1 of 1 - PCV) Never done   Colonoscopy  09/09/2023   Medicare Annual Wellness (AWV)  05/19/2024   INFLUENZA VACCINE  Completed   Hepatitis C Screening  Completed   HIV Screening  Completed   HPV VACCINES  Aged Out  Had Shingles shots  Health Maintenance  Health Maintenance Due  Topic Date Due   COVID-19 Vaccine (1) Never done   Zoster Vaccines- Shingrix (1 of 2) Never done   DTaP/Tdap/Td (2 - Td or Tdap) 04/18/2021   Pneumonia Vaccine 57+ Years old (1 of 1 - PCV) Never done   Colonoscopy  09/09/2023    Colorectal cancer screening: Type of screening: Colonoscopy. Completed 09/08/20. Repeat every 3 years  Lung Cancer Screening: (Low Dose CT Chest recommended if Age 70-80 years, 20 pack-year currently smoking OR have quit w/in 15years.) does not qualify.   Additional Screening:  Hepatitis C Screening: does  qualify;  Completed 05/08/20  Vision Screening: Recommended annual ophthalmology exams for early detection of glaucoma and other disorders of the eye. Is the patient up to date with their annual eye exam?  No  Who is the provider or what is the name of the office in which the patient attends annual eye exams? No one If pt is not established with a provider, would they like to be referred to a provider to establish care? No .   Dental Screening: Recommended annual dental exams for proper oral hygiene   Community Resource Referral / Chronic Care Management: CRR required this visit?  No   CCM required this visit?  No    Plan:     I have personally reviewed and noted the following in the patient's chart:   Medical and social history Use of alcohol, tobacco or illicit drugs  Current medications and supplements including opioid prescriptions. Patient is not currently taking opioid prescriptions. Functional ability and status Nutritional status Physical activity Advanced directives List of other physicians Hospitalizations, surgeries, and ER visits in previous 12 months Vitals Screenings to include cognitive, depression, and falls Referrals and appointments  In addition, I have reviewed and discussed with patient certain preventive protocols, quality metrics, and best practice recommendations. A written personalized care plan for preventive services as well as general preventive health recommendations were provided to patient.     Hal Hope, LPN   [id number]   After Visit Summary: (MyChart) Due to this being a telephonic visit, the after visit summary with patients personalized plan was offered to patient via MyChart   Nurse Notes: none

## 2023-06-18 ENCOUNTER — Ambulatory Visit: Payer: PPO | Admitting: Dermatology

## 2023-06-18 ENCOUNTER — Encounter: Payer: Self-pay | Admitting: Dermatology

## 2023-06-18 DIAGNOSIS — L720 Epidermal cyst: Secondary | ICD-10-CM | POA: Diagnosis not present

## 2023-06-18 DIAGNOSIS — D489 Neoplasm of uncertain behavior, unspecified: Secondary | ICD-10-CM

## 2023-06-18 MED ORDER — MUPIROCIN 2 % EX OINT: 1.0000 | TOPICAL_OINTMENT | Freq: Every day | CUTANEOUS | 0 refills | Status: DC

## 2023-06-18 NOTE — Patient Instructions (Signed)

## 2023-06-18 NOTE — Progress Notes (Signed)
   Follow-Up Visit   Subjective  Jorge Knox is a 67 y.o. male who presents for the following: Excision of cyst at right upper central back  The following portions of the chart were reviewed this encounter and updated as appropriate: medications, allergies, medical history  Review of Systems:  No other skin or systemic complaints except as noted in HPI or Assessment and Plan.  Objective  Well appearing patient in no apparent distress; mood and affect are within normal limits.  A focused examination was performed of the following areas: Back, neck Relevant physical exam findings are noted in the Assessment and Plan.   Right Upper Central Back  Right Upper Central Back Subcutaneous nodule 2.0 cm    Assessment & Plan   EIC (epidermal inclusion cyst) Right Upper Central Back  Skin excision - Right Upper Central Back  Lesion length (cm):  2 Margin per side (cm):  0 Total excision diameter (cm):  2 Informed consent: discussed and consent obtained   Timeout: patient name, date of birth, surgical site, and procedure verified   Procedure prep:  Patient was prepped and draped in usual sterile fashion Prep type:  Chlorhexidine Anesthesia: the lesion was anesthetized in a standard fashion   Anesthetic:  1% lidocaine w/ epinephrine 1-100,000 buffered w/ 8.4% NaHCO3 (6 cc lido w/epi, 4 cc bupivicaine) Instrument used: #15 blade   Hemostasis achieved with: pressure   Outcome: patient tolerated procedure well with no complications   Post-procedure details: sterile dressing applied and wound care instructions given   Dressing type: petrolatum, pressure dressing and bandage    Skin repair - Right Upper Central Back Complexity:  Intermediate Final length (cm):  4 Informed consent: discussed and consent obtained   Timeout: patient name, date of birth, surgical site, and procedure verified   Procedure prep:  Patient was prepped and draped in usual sterile fashion Prep type:   Chlorhexidine Anesthesia: the lesion was anesthetized in a standard fashion   Anesthetic:  1% lidocaine w/ epinephrine 1-100,000 buffered w/ 8.4% NaHCO3 Reason for type of repair: reduce tension to allow closure, reduce the risk of dehiscence, infection, and necrosis, reduce subcutaneous dead space and avoid a hematoma, allow closure of the large defect and preserve normal anatomy   Undermining: edges could be approximated without difficulty   Subcutaneous layers (deep stitches):  Suture size:  3-0 Suture type: Monocryl (poliglecaprone 25)   Stitches:  Buried vertical mattress Fine/surface layer approximation (top stitches):  Suture size:  4-0 Suture type: Prolene (polypropylene)   Stitches: simple running   Hemostasis achieved with: suture, pressure and electrodesiccation Outcome: patient tolerated procedure well with no complications   Post-procedure details: sterile dressing applied and wound care instructions given   Dressing type: petrolatum, bandage and pressure dressing   Additional details:  Patient's daughter will take sutures out in 1 week. Patient to let us know if any concerns.   Specimen 1 - Surgical pathology Differential Diagnosis: EIC  Check Margins: No Subcutaneous nodule 2.0 cm     Return for TBSE, with Dr. Katrinka Blazing, as scheduled.  Anise Salvo, RMA, am acting as scribe for Elie Goody, MD .   Documentation: I have reviewed the above documentation for accuracy and completeness, and I agree with the above.  Elie Goody, MD

## 2023-06-19 ENCOUNTER — Telehealth: Payer: Self-pay

## 2023-06-19 NOTE — Telephone Encounter (Signed)
Patient doing fine after yesterdays surgery. Donata Reddick S., RMA 

## 2023-06-23 LAB — SURGICAL PATHOLOGY

## 2023-06-25 ENCOUNTER — Telehealth: Payer: Self-pay

## 2023-06-25 NOTE — Telephone Encounter (Signed)
-----   Message from [place] sent at 06/23/2023  5:50 PM EST ----- Diagnosis right upper central back :       EPIDERMOID CYST    Please call to share that excision removed benign cyst as expected and get update on surgical wound. Thank you.

## 2023-06-25 NOTE — Telephone Encounter (Signed)
Discussed pathology results. Patient voiced understanding. Site is healing well. Keep appointment for next October.

## 2023-07-04 ENCOUNTER — Encounter: Payer: Self-pay | Admitting: Family Medicine

## 2023-07-04 ENCOUNTER — Ambulatory Visit: Payer: Self-pay

## 2023-07-04 ENCOUNTER — Ambulatory Visit: Payer: PPO | Admitting: Family Medicine

## 2023-07-04 VITALS — BP 133/79 | HR 75 | Ht 71.0 in | Wt 247.0 lb

## 2023-07-04 DIAGNOSIS — R509 Fever, unspecified: Secondary | ICD-10-CM

## 2023-07-04 DIAGNOSIS — U071 COVID-19: Secondary | ICD-10-CM | POA: Diagnosis not present

## 2023-07-04 DIAGNOSIS — I10 Essential (primary) hypertension: Secondary | ICD-10-CM | POA: Diagnosis not present

## 2023-07-04 LAB — POCT INFLUENZA A/B
Influenza A, POC: NEGATIVE
Influenza B, POC: NEGATIVE

## 2023-07-04 LAB — POC COVID19 BINAXNOW: SARS Coronavirus 2 Ag: POSITIVE — AB

## 2023-07-04 MED ORDER — NIRMATRELVIR/RITONAVIR (PAXLOVID)TABLET: 3.0000 | ORAL_TABLET | Freq: Two times a day (BID) | ORAL | 0 refills | Status: AC

## 2023-07-04 MED ORDER — LOSARTAN POTASSIUM-HCTZ 100-12.5 MG PO TABS: 1.0000 | ORAL_TABLET | Freq: Every day | ORAL | 0 refills | Status: DC

## 2023-07-04 NOTE — Assessment & Plan Note (Signed)
Chronic, concern for ACEI related cough Stop lisiopril; start hyzaar 1 month f/u

## 2023-07-04 NOTE — Progress Notes (Signed)
Established patient visit   Patient: Jorge Knox   DOB: 08/06/1955   67 y.o. Male  MRN: [id number] Visit Date: 07/04/2023  Today's healthcare provider: Jacky Kindle, FNP  Introduced to nurse practitioner role and practice setting.  All questions answered.  Discussed provider/patient relationship and expectations.  Chief Complaint  Patient presents with   coughing    Pt stated--coughing for 1 month, but few days congestion, fever, chills 100 for the last 2 days   Subjective    HPI HPI     coughing    Additional comments: Pt stated--coughing for 1 month, but few days congestion, fever, chills 100 for the last 2 days      Last edited by Shelly Bombard, CMA on 07/04/2023  1:17 PM.      Medications: Outpatient Medications Prior to Visit  Medication Sig   atorvastatin (LIPITOR) 20 MG tablet TAKE 1 TABLET BY MOUTH EVERY DAY   mupirocin ointment (BACTROBAN) 2 % Apply 1 Application topically daily. With dressing changes   [DISCONTINUED] lisinopril (ZESTRIL) 20 MG tablet Take 1 tablet (20 mg total) by mouth daily.   No facility-administered medications prior to visit.   Last CBC Lab Results  Component Value Date   WBC 5.5 04/29/2023   HGB 15.9 04/29/2023   HCT 49.6 04/29/2023   MCV 94 04/29/2023   MCH 30.2 04/29/2023   RDW 12.1 04/29/2023   PLT 219 04/29/2023   Last metabolic panel Lab Results  Component Value Date   GLUCOSE 114 (H) 04/29/2023   NA 142 04/29/2023   K 4.9 04/29/2023   CL 104 04/29/2023   CO2 24 04/29/2023   BUN 15 04/29/2023   CREATININE 1.09 04/29/2023   EGFR 75 04/29/2023   CALCIUM 9.8 04/29/2023   PROT 6.5 04/29/2023   ALBUMIN 4.3 04/29/2023   LABGLOB 2.2 04/29/2023   AGRATIO 1.8 12/27/2021   BILITOT 1.0 04/29/2023   ALKPHOS 78 04/29/2023   AST 20 04/29/2023   ALT 31 04/29/2023   Last lipids Lab Results  Component Value Date   CHOL 125 04/29/2023   HDL 30 (L) 04/29/2023   LDLCALC 79 04/29/2023   TRIG 82 04/29/2023    CHOLHDL 4.2 04/29/2023   Last hemoglobin A1c Lab Results  Component Value Date   HGBA1C 5.6 04/29/2023   Last thyroid functions Lab Results  Component Value Date   TSH 2.940 04/29/2023     Objective    BP 133/79 (BP Location: Right Arm, Patient Position: Sitting, Cuff Size: Large)   Pulse 75   Ht 5\' 11"  (1.803 m)   Wt 247 lb (112 kg)   BMI 34.45 kg/m   BP Readings from Last 3 Encounters:  07/04/23 133/79  04/29/23 (!) 107/58  04/22/23 (!) 142/74   Wt Readings from Last 3 Encounters:  07/04/23 247 lb (112 kg)  04/29/23 248 lb 12.8 oz (112.9 kg)  03/27/23 247 lb (112 kg)   SpO2 Readings from Last 3 Encounters:  04/29/23 99%  03/27/23 97%  02/17/23 97%   Physical Exam Vitals and nursing note reviewed.  Constitutional:      Appearance: Normal appearance. He is obese. He is ill-appearing.     Comments: Appears fatigued  HENT:     Head: Normocephalic and atraumatic.  Cardiovascular:     Rate and Rhythm: Normal rate and regular rhythm.     Pulses: Normal pulses.     Heart sounds: Normal heart sounds.  Pulmonary:  Effort: Pulmonary effort is normal.     Breath sounds: Normal breath sounds.     Comments: Chronic cough Musculoskeletal:        General: Normal range of motion.     Cervical back: Normal range of motion.  Skin:    General: Skin is warm and dry.     Capillary Refill: Capillary refill takes less than 2 seconds.  Neurological:     General: No focal deficit present.     Mental Status: He is alert and oriented to person, place, and time. Mental status is at baseline.  Psychiatric:        Mood and Affect: Mood normal.        Behavior: Behavior normal.        Thought Content: Thought content normal.        Judgment: Judgment normal.     Results for orders placed or performed in visit on 07/04/23  POCT Influenza A/B  Result Value Ref Range   Influenza A, POC Negative Negative   Influenza B, POC Negative Negative  POC COVID-19  Result Value Ref  Range   SARS Coronavirus 2 Ag Positive (A) Negative    Assessment & Plan     Problem List Items Addressed This Visit       Cardiovascular and Mediastinum   Primary hypertension   Chronic, concern for ACEI related cough Stop lisiopril; start hyzaar 1 month f/u      Relevant Medications   losartan-hydrochlorothiazide (HYZAAR) 100-12.5 MG tablet     Other   Fever - Primary   2 days of low grade fever; previously negative POC COVID 12/18 COVID/Flu A&B repeated; pt is today COVID positive Request for antivirals given anticipated arrival of grandchild eGFR verified       Relevant Orders   POCT Influenza A/B (Completed)   POC COVID-19 (Completed)   Other Visit Diagnoses       COVID       Relevant Medications   nirmatrelvir/ritonavir (PAXLOVID) 20 x 150 MG & 10 x 100MG  TABS      Return in about 4 weeks (around 08/01/2023), or if symptoms worsen or fail to improve, for HTN management.     Leilani Merl, FNP, have reviewed all documentation for this visit. The documentation on 07/04/23 for the exam, diagnosis, procedures, and orders are all accurate and complete.  Jacky Kindle, FNP  [facility] [phone] (phone) [phone] (fax)  [facility]

## 2023-07-04 NOTE — Assessment & Plan Note (Signed)
2 days of low grade fever; previously negative POC COVID 12/18 COVID/Flu A&B repeated; pt is today COVID positive Request for antivirals given anticipated arrival of grandchild eGFR verified

## 2023-07-04 NOTE — Telephone Encounter (Signed)
Message from [place] C sent at 07/04/2023  4:07 PM EST  Summary: rx concern   The patient's pharmacist advised the patient to also stay off of their cholesterol medication for 7 days while taking nirmatrelvir/ritonavir (PAXLOVID) 20 x 150 MG & 10 x 100MG  TABS [[id number]]  The patient would like to speak with a member of clinical staff further when possible about a potential interaction  Please contact when possible         Chief Complaint: requesting different BP med. Co pay "too high" @ $ 100.00 per refill.  Disposition: [] ED /[] Urgent Care (no appt availability in office) / [] Appointment(In office/virtual)/ []  [place] [facility]/ [] Home Care/ [] Refused Recommended Disposition /[] [place] [facility]/ [x]  Follow-up with PCP Additional Notes: Copay was very high for med for losartan-hydrochlorothiazide 100 per refill. Asking for a different prescription.  Pt stated he  will restart take lisinopril which was dc'd 07/04/23.   Advised that pharmacist told him to stop Atorvastatin while on Paxlovid. And per [employer] advised it can cause muscle breakdown.   Reason for Disposition  [1] Caller has NON-URGENT medicine question about med that PCP prescribed AND [2] triager unable to answer question  Protocols used: Medication Refill and Renewal Call-A-AH

## 2023-07-06 ENCOUNTER — Other Ambulatory Visit: Payer: Self-pay | Admitting: Family Medicine

## 2023-07-07 ENCOUNTER — Telehealth: Payer: Self-pay

## 2023-07-07 ENCOUNTER — Encounter: Payer: Self-pay | Admitting: Physician Assistant

## 2023-07-07 ENCOUNTER — Ambulatory Visit: Payer: Self-pay

## 2023-07-07 DIAGNOSIS — I10 Essential (primary) hypertension: Secondary | ICD-10-CM

## 2023-07-07 NOTE — Telephone Encounter (Signed)
Copied from CRM [id number]. Topic: General - Other >> Jul 04, 2023  4:02 PM Jorge Knox wrote: Reason for CRM: The patient has called to request a cost effective alternative to their recent prescription of losartan-hydrochlorothiazide (HYZAAR) 100-12.5 MG tablet [[id number]], the patient would like to have a prescription for a medication that will be covered by their insurance    Please contact the patient further when possible

## 2023-07-07 NOTE — Telephone Encounter (Signed)
  Chief Complaint: medication problem Symptoms: NA Frequency:  Pertinent Negatives: NA Disposition: [] ED /[] Urgent Care (no appt availability in office) / [] Appointment(In office/virtual)/ []  [place] [facility]/ [] Home Care/ [] Refused Recommended Disposition /[] [place] [facility]/ [x]  Follow-up with PCP Additional Notes: pt is needing 2 week supply of losartan-hydrochlorothiazide called in rather than 90 DS. Pt called [employer] and they advised him after New Year 2025, he wouldn't have to pay for the medication but right now he is having to pay and its expensive for 90 DS so pt is wanting to have 2 week supply sent in so he can get that and wait til after the first of the year 2025 and then have 90 DS sent in so [employer] will cover. Advised pt I would send message back to [place], [place] so new rx can be sent in.   Summary: medication for BP needed   Patient clld stated he was taken off his BP medication by one of his physicians but it was to a med tht he was on before and taken off so she changed it back to the one he was on and now he is needing a different medication for his BP. He request a call from the nurse so he can give all the info. Please f/u with patient         Reason for Disposition  [1] Caller has URGENT medicine question about med that PCP or specialist prescribed AND [2] triager unable to answer question  Answer Assessment - Initial Assessment Questions 1. NAME of MEDICINE: "What medicine(s) are you calling about?"     Losartan-hydrochlorothiazide  2. QUESTION: "What is your question?" (e.g., double dose of medicine, side effect)     Needing 2 week supply since insurance will cover after new year 2025.  3. PRESCRIBER: "Who prescribed the medicine?" Reason: if prescribed by specialist, call should be referred to that group.     Edmon Crape, PA  Protocols used: Medication Question Call-A-AH

## 2023-07-07 NOTE — Telephone Encounter (Signed)
Copied from CRM [id number]. Topic: General - Other >> Jul 07, 2023 10:39 AM Macon Large wrote: Reason for CRM: Pt stated that his Rx for blood pressure medication was switched but he can not take the losartan-hydrochlorothiazide (HYZAAR) 100-12.5 MG tablet. Pt requests that a Rx for another blood pressure medication be sent to his pharmacy.

## 2023-07-07 NOTE — Telephone Encounter (Signed)
Duplicate encounter. Patient has called 2 other times today. Msg has already been routed to provider

## 2023-07-07 NOTE — Telephone Encounter (Signed)
All encounters are now under this thread for reference purpose

## 2023-07-07 NOTE — Telephone Encounter (Signed)
Jorge Ludwig, RN     07/07/23  2:18 PM Note   Chief Complaint: medication problem Symptoms: NA Frequency:  Pertinent Negatives: NA Disposition: [] ED /[] Urgent Care (no appt availability in office) / [] Appointment(In office/virtual)/ []  [place] [facility]/ [] Home Care/ [] Refused Recommended Disposition /[] [place] [facility]/ [x]  Follow-up with PCP Additional Notes: pt is needing 2 week supply of losartan-hydrochlorothiazide called in rather than 90 DS. Pt called [employer] and they advised him after New Year 2025, he wouldn't have to pay for the medication but right now he is having to pay and its expensive for 90 DS so pt is wanting to have 2 week supply sent in so he can get that and wait til after the first of the year 2025 and then have 90 DS sent in so [employer] will cover. Advised pt I would send message back to [place], [place] so new rx can be sent in.       Summary: medication for BP needed    Patient clld stated he was taken off his BP medication by one of his physicians but it was to a med tht he was on before and taken off so she changed it back to the one he was on and now he is needing a different medication for his BP. He request a call from the nurse so he can give all the info. Please f/u with patient          Reason for Disposition  [1] Caller has URGENT medicine question about med that PCP or specialist prescribed AND [2] triager unable to answer question  Answer Assessment - Initial Assessment Questions 1. NAME of MEDICINE: "What medicine(s) are you calling about?"     Losartan-hydrochlorothiazide  2. QUESTION: "What is your question?" (e.g., double dose of medicine, side effect)     Needing 2 week supply since insurance will cover after new year 2025.  3. PRESCRIBER: "Who prescribed the medicine?" Reason: if prescribed by specialist, call should be referred to that group.     Edmon Crape, PA  Protocols used: Medication Question Call-A-AH

## 2023-07-09 MED ORDER — LOSARTAN POTASSIUM-HCTZ 100-12.5 MG PO TABS: 1.0000 | ORAL_TABLET | Freq: Every day | ORAL | 0 refills | Status: DC

## 2023-07-09 NOTE — Telephone Encounter (Signed)
Please, let pt know that if he will use goodrx application, he might get hyzaar for 2 weeks for 13.41$ and for 1 month for 16.41$  I hope it will be helpful.  a request for a similar alternative was sent to his pharmacy.  Merita Norton is leaving the practice. On her behalf. Debera Lat, [facility], MMS [facility] [phone] (phone) [phone] (fax)

## 2023-07-09 NOTE — Addendum Note (Signed)
Addended by: Debera Lat on: 07/09/2023 08:12 PM   Modules accepted: Orders

## 2023-07-10 ENCOUNTER — Telehealth: Payer: Self-pay | Admitting: Physician Assistant

## 2023-07-10 NOTE — Telephone Encounter (Signed)
Referral Request - Did the patient discuss referral with their provider in the last year? Yes (If No - schedule appointment) (If Yes - send message)  Appointment offered? No  Type of order/referral and detailed reason for visit: chest xray for a cough he has has over a month.   Preference of office, provider, location: any  If referral order, have you been seen by this specialty before? No (If Yes, this issue or another issue? When? Where?  Can we respond through MyChart? Yes

## 2023-07-10 NOTE — Telephone Encounter (Signed)
LVMTCB. CRM created. Ok for [facility] to advise/clarify.

## 2023-07-10 NOTE — Telephone Encounter (Signed)
Reached pt. States he'll look into Goodrx for the 2 week prescription. States he had asked for a CXR and has not heard anything as of yet. States cough is better "But I still want to know what's going on and that everything is ok."

## 2023-07-11 ENCOUNTER — Other Ambulatory Visit: Payer: Self-pay | Admitting: Family Medicine

## 2023-07-11 DIAGNOSIS — R053 Chronic cough: Secondary | ICD-10-CM

## 2023-07-11 NOTE — Telephone Encounter (Signed)
Pt advised. Appt scheduled with [place]. Reports he wants to make sure she knows everything that's going on

## 2023-07-17 ENCOUNTER — Ambulatory Visit (INDEPENDENT_AMBULATORY_CARE_PROVIDER_SITE_OTHER): Payer: PPO | Admitting: Family Medicine

## 2023-07-17 ENCOUNTER — Encounter: Payer: Self-pay | Admitting: Family Medicine

## 2023-07-17 ENCOUNTER — Ambulatory Visit
Admission: RE | Admit: 2023-07-17 | Discharge: 2023-07-17 | Disposition: A | Discharge status: Home / Self Care | Payer: PPO | Attending: Family Medicine | Admitting: Family Medicine

## 2023-07-17 ENCOUNTER — Ambulatory Visit
Admission: RE | Admit: 2023-07-17 | Discharge: 2023-07-17 | Disposition: A | Discharge status: Home / Self Care | Payer: PPO | Source: Ambulatory Visit | Attending: Family Medicine | Admitting: Family Medicine

## 2023-07-17 VITALS — BP 123/81 | HR 66 | Temp 97.9°F | Ht 71.0 in | Wt 251.0 lb

## 2023-07-17 DIAGNOSIS — I10 Essential (primary) hypertension: Secondary | ICD-10-CM | POA: Diagnosis not present

## 2023-07-17 DIAGNOSIS — R053 Chronic cough: Secondary | ICD-10-CM

## 2023-07-17 DIAGNOSIS — J411 Mucopurulent chronic bronchitis: Secondary | ICD-10-CM

## 2023-07-17 DIAGNOSIS — U071 COVID-19: Secondary | ICD-10-CM | POA: Diagnosis not present

## 2023-07-17 MED ORDER — AMOXICILLIN-POT CLAVULANATE 875-125 MG PO TABS: 1.0000 | ORAL_TABLET | Freq: Two times a day (BID) | ORAL | 0 refills | Status: DC

## 2023-07-17 MED ORDER — PREDNISONE 10 MG PO TABS: ORAL_TABLET | ORAL | 0 refills | Status: AC

## 2023-07-17 MED ORDER — ALBUTEROL SULFATE HFA 108 (90 BASE) MCG/ACT IN AERS
2.0000 | INHALATION_SPRAY | Freq: Four times a day (QID) | RESPIRATORY_TRACT | 0 refills | Status: DC | PRN
Start: 2023-07-17 — End: 2024-06-03

## 2023-07-17 MED ORDER — BENZONATATE 100 MG PO CAPS: 100.0000 mg | ORAL_CAPSULE | Freq: Two times a day (BID) | ORAL | 0 refills | Status: DC | PRN

## 2023-07-17 NOTE — Assessment & Plan Note (Signed)
 Pt on Hyzaar 100/12.5mg  daily BP today 123/81 - continue daily medication - low sodium diet and daily exercise recommended - at home monitoring with automatic upper arm cuff recommended.

## 2023-07-17 NOTE — Progress Notes (Signed)
 Acute Office Visit  Introduced to nurse practitioner role and practice setting.  All questions answered.  Discussed provider/patient relationship and expectations.   Subjective:     Patient ID: Jorge Knox, male    DOB: 1955-12-25, 68 y.o.   MRN: [id number]  Chief Complaint  Patient presents with   Cough    Patient reports he has had a cough fro almost 2 months and has not been able to get rid of it.  He was told it was from his blood pressure medication and it was changed from Lisinopril  to Losartan .  No change in his cough.  He was treated for Covid but said the cough hasn't changed during that illness or after the treatment.  Patient does not believe he had Covid.    Reports cough for nearly two months. Has tried mucinex, Sudaphed, cough drops, and codiene cough syrup without any relief. States coughing up brown to greenish sputum. Can't go 30 minutes without coughing. Will wheeze at night. Pt was positive for COVID, but he does not believe he actually had COVID as his at home tests were negative, and does not think his cough would be this long. He is concerned he had a bacterial infection given how longer it has lasted.  Denies chills, rigors, fevers, myalgias, ear pain, headaches, congestion, palpitations, difficulty eating or drinking, dizziness, chest pain/tightness.   Lisinopril  was titrated down, cough no change.  Kelly Cedar, FNP - ordered chest xray and pulmonary referral on 07/11/23 for pt.     Cough This is a recurrent problem. The current episode started more than 1 month ago. The problem has been unchanged. The cough is Productive of bloody sputum, productive of brown sputum and productive of purulent sputum. Associated symptoms include wheezing. The symptoms are aggravated by lying down and cold air. He has tried prescription cough suppressant for the symptoms. The treatment provided no relief.    Review of Systems  Respiratory:  Positive for cough and wheezing.    All other systems reviewed and are negative.     Objective:    BP 123/81 (BP Location: Left Arm, Patient Position: Sitting, Cuff Size: Large)   Pulse 66   Temp 97.9 F (36.6 C) (Oral)   Ht 5' 11 (1.803 m)   Wt 251 lb (113.9 kg)   SpO2 96%   BMI 35.01 kg/m    Physical Exam Constitutional:      Appearance: Normal appearance. He is obese.  HENT:     Head: Normocephalic.     Right Ear: Tympanic membrane, ear canal and external ear normal.     Left Ear: Tympanic membrane, ear canal and external ear normal.     Nose: Nose normal. No congestion or rhinorrhea.     Mouth/Throat:     Mouth: Mucous membranes are moist.     Pharynx: Oropharynx is clear. No oropharyngeal exudate or posterior oropharyngeal erythema.  Eyes:     General:        Right eye: No discharge.        Left eye: No discharge.     Extraocular Movements: Extraocular movements intact.     Conjunctiva/sclera: Conjunctivae normal.     Pupils: Pupils are equal, round, and reactive to light.  Cardiovascular:     Rate and Rhythm: Normal rate and regular rhythm.     Pulses: Normal pulses.     Heart sounds: Normal heart sounds. No murmur heard.    No friction rub. No gallop.  Pulmonary:  Effort: No respiratory distress.     Breath sounds: No stridor. No wheezing, rhonchi or rales.     Comments: Constant coughing while in exam and visit Chest:     Chest wall: No tenderness.  Musculoskeletal:     Cervical back: No tenderness.  Lymphadenopathy:     Cervical: No cervical adenopathy.  Skin:    General: Skin is warm and dry.     Capillary Refill: Capillary refill takes less than 2 seconds.  Neurological:     Mental Status: He is alert.    No results found for any visits on 07/17/23.     Assessment & Plan:   Problem List Items Addressed This Visit       Cardiovascular and Mediastinum   Primary hypertension   Pt on Hyzaar 100/12.5mg  daily BP today 123/81 - continue daily medication - low sodium diet  and daily exercise recommended - at home monitoring with automatic upper arm cuff recommended.        Respiratory   Purulent bronchitis (HCC)   Given time course of cough symptoms will order ABX - augmentin  for 7 day for broad spectrum coverage - Discussed viral vs bacterial infection needs with pt.  - Please continue to keep pulmonary appt on 08/07/23 - Chest xray ordered - Cough symptomatic mgmt      Relevant Medications   amoxicillin -clavulanate (AUGMENTIN ) 875-125 MG tablet     Other   Chronic cough - Primary   Chronic cough for nearly two months.  - Will order tessalon  for cough suppressant - Prednisone  6 day taper - Albuterol  for as needed wheezing - Chest xray placed on 07/11/23 by Kelly Cedar, FNP - stressed pt to get imaging - Pulmonary referral placed on 07/11/23 by Kelly Cedar, FNP - appt on 08/07/23 - stressed keeping appt.  - Continue hot tea with honey - Continue throat lozenges - Humidification - increase water intake - Mucinex for sputum expectorant.       Relevant Medications   predniSONE  (DELTASONE ) 10 MG tablet   albuterol  (VENTOLIN  HFA) 108 (90 Base) MCG/ACT inhaler   benzonatate  (TESSALON ) 100 MG capsule      Meds ordered this encounter  Medications   predniSONE  (DELTASONE ) 10 MG tablet    Sig: 6 tablets for 1 day, then 5 for 1 day, then 4 for 1 day, then 3 for 1 day, then 2 for 1 day then 1 for 1 day.    Dispense:  21 tablet    Refill:  0   albuterol  (VENTOLIN  HFA) 108 (90 Base) MCG/ACT inhaler    Sig: Inhale 2 puffs into the lungs every 6 (six) hours as needed for wheezing or shortness of breath.    Dispense:  8 g    Refill:  0   benzonatate  (TESSALON ) 100 MG capsule    Sig: Take 1 capsule (100 mg total) by mouth 2 (two) times daily as needed for cough.    Dispense:  20 capsule    Refill:  0   amoxicillin -clavulanate (AUGMENTIN ) 875-125 MG tablet    Sig: Take 1 tablet by mouth 2 (two) times daily.    Dispense:  14 tablet    Refill:  0     Return if symptoms worsen or fail to improve.  I, Curtis DELENA Boom, FNP, have reviewed all documentation for this visit. The documentation on 07/17/23 for the exam, diagnosis, procedures, and orders are all accurate and complete.   Curtis DELENA Boom, FNP

## 2023-07-17 NOTE — Assessment & Plan Note (Signed)
 Given time course of cough symptoms will order ABX - augmentin  for 7 day for broad spectrum coverage - Discussed viral vs bacterial infection needs with pt.  - Please continue to keep pulmonary appt on 08/07/23 - Chest xray ordered - Cough symptomatic mgmt

## 2023-07-17 NOTE — Assessment & Plan Note (Signed)
 Chronic cough for nearly two months.  - Will order tessalon  for cough suppressant - Prednisone  6 day taper - Albuterol  for as needed wheezing - Chest xray placed on 07/11/23 by Kelly Cedar, FNP - stressed pt to get imaging - Pulmonary referral placed on 07/11/23 by Kelly Cedar, FNP - appt on 08/07/23 - stressed keeping appt.  - Continue hot tea with honey - Continue throat lozenges - Humidification - increase water intake - Mucinex for sputum expectorant.

## 2023-07-18 ENCOUNTER — Ambulatory Visit: Payer: PPO | Admitting: Physician Assistant

## 2023-07-24 ENCOUNTER — Telehealth: Payer: Self-pay

## 2023-07-24 NOTE — Telephone Encounter (Signed)
 Patient called requesting chest x-ray results from 07/17/23. Advised patient provider comments have not been added to the results yet. Please review and advised. Patient aware of Pulmonary appointment 08/07/23.

## 2023-07-25 ENCOUNTER — Encounter: Payer: Self-pay | Admitting: Family Medicine

## 2023-07-25 NOTE — Progress Notes (Signed)
 Chest Xray shows opacities in lung bases, potential atypical infection. Pt was prescribed ABX that day.   Please f/u with pulmonary as scheduled on 08/07/23 to further assessment

## 2023-08-04 ENCOUNTER — Ambulatory Visit: Payer: PPO | Admitting: Physician Assistant

## 2023-08-07 ENCOUNTER — Encounter: Payer: Self-pay | Admitting: Student in an Organized Health Care Education/Training Program

## 2023-08-07 ENCOUNTER — Ambulatory Visit: Payer: PPO | Admitting: Student in an Organized Health Care Education/Training Program

## 2023-08-07 VITALS — BP 126/76 | HR 75 | Temp 97.6°F | Ht 71.0 in | Wt 250.2 lb

## 2023-08-07 DIAGNOSIS — R052 Subacute cough: Secondary | ICD-10-CM | POA: Diagnosis not present

## 2023-08-07 MED ORDER — LORATADINE 10 MG PO TABS: 10.0000 mg | ORAL_TABLET | Freq: Every day | ORAL | 2 refills | Status: DC

## 2023-08-07 NOTE — Progress Notes (Signed)
Assessment & Plan:   1. Subacute cough (Primary)  Presents for the evaluation of subacute cough that is now mostly resolved.  He continues to have tickle in his throat and feels the need to clear his throat almost daily.  This is not worsened with activity or with positional changes.  He does not have any shortness of breath.  Lung exam today is clear and he does not have any wheeze or rales. There is also no personal history of lung disease. I have personally reviewed his chest xray and it appears to be within normal.  I suspect he had postviral cough which seems to have resolved following a course of prednisone.  There is also the potential for upper airway cough syndrome (UACS) which we will attempt to treat with a second-generation nonsedating antihistamine.  I will send him a prescription for loratadine and have asked him to trial this and see how his symptoms respond.  - loratadine (CLARITIN) 10 MG tablet; Take 1 tablet (10 mg total) by mouth daily.  Dispense: 30 tablet; Refill: 2   Return if symptoms worsen or fail to improve.  I spent 60 minutes caring for this patient today, including preparing to see the patient, obtaining a medical history , reviewing a separately obtained history, performing a medically appropriate examination and/or evaluation, counseling and educating the patient/family/caregiver, ordering medications, tests, or procedures, documenting clinical information in the electronic health record, and independently interpreting results (not separately reported/billed) and communicating results to the patient/family/caregiver  Raechel Chute, MD [facility] 08/07/2023 11:45 AM    End of visit medications:  Meds ordered this encounter  Medications   loratadine (CLARITIN) 10 MG tablet    Sig: Take 1 tablet (10 mg total) by mouth daily.    Dispense:  30 tablet    Refill:  2     Current Outpatient Medications:    atorvastatin (LIPITOR) 20 MG  tablet, TAKE 1 TABLET BY MOUTH EVERY DAY, Disp: 90 tablet, Rfl: 0   loratadine (CLARITIN) 10 MG tablet, Take 1 tablet (10 mg total) by mouth daily., Disp: 30 tablet, Rfl: 2   losartan-hydrochlorothiazide (HYZAAR) 100-12.5 MG tablet, Take 1 tablet by mouth daily., Disp: 14 tablet, Rfl: 0   albuterol (VENTOLIN HFA) 108 (90 Base) MCG/ACT inhaler, Inhale 2 puffs into the lungs every 6 (six) hours as needed for wheezing or shortness of breath. (Patient not taking: Reported on 08/07/2023), Disp: 8 g, Rfl: 0   Subjective:   PATIENT ID: Jorge Knox GENDER: male DOB: 11/12/1955, MRN: [id number]  Chief Complaint  Patient presents with   Consult    Occasional cough with phlegm. No shortness of breath or wheezing. Completed Augmentin a few weeks ago. Has not needed to use Albuterol inhaler.     HPI  Patient is a pleasant 68 year old male with a past medical history of hypertension and hyperlipidemia presents to clinic for the evaluation of subacute cough that is now resolved.  Patient symptoms started in December 2024 when he developed a cough that was persistent.  Around that time, he was noted to be COVID-positive but he reports being essentially asymptomatic from it.  He did not have any fevers, chills, shortness of breath, night sweats, or myalgia.  He was also previously on lisinopril which was switched to losartan. This cough was not improved with any symptomatic remedies and he re-presented to his PCPs office and was prescribed a course of Augmentin and prednisone with significant improvement in symptoms.  Today, he feels that the cough is essentially resolved.  He continues to have slight tickle in his throat that occasionally causes him to cough.  Otherwise, he feels great.  He does not have any shortness of breath and is able to do all the activities of his daily living without any limitation.  He is an avid Therapist, nutritional and fisherman and is able to do all of this without any limitation.  Patient  previously worked in a [employer].  He has a history of smoking but quit many years ago.  He denies any occupational exposures.  Ancillary information including prior medications, full medical/surgical/family/social histories, and PFTs (when available) are listed below and have been reviewed.   Review of Systems  Constitutional:  Negative for chills, diaphoresis, fever, malaise/fatigue and weight loss.  Respiratory:  Negative for cough, hemoptysis, sputum production, shortness of breath and wheezing.   Cardiovascular:  Negative for chest pain, palpitations and orthopnea.     Objective:   Vitals:   08/07/23 1004  BP: 126/76  Pulse: 75  Temp: 97.6 F (36.4 C)  TempSrc: Temporal  SpO2: 98%  Weight: 250 lb 3.2 oz (113.5 kg)  Height: 5\' 11"  (1.803 m)   98% on RA BMI Readings from Last 3 Encounters:  08/07/23 34.90 kg/m  07/17/23 35.01 kg/m  07/04/23 34.45 kg/m   Wt Readings from Last 3 Encounters:  08/07/23 250 lb 3.2 oz (113.5 kg)  07/17/23 251 lb (113.9 kg)  07/04/23 247 lb (112 kg)    Physical Exam Constitutional:      Appearance: Normal appearance.  Cardiovascular:     Rate and Rhythm: Normal rate and regular rhythm.     Pulses: Normal pulses.     Heart sounds: Normal heart sounds.  Pulmonary:     Effort: Pulmonary effort is normal. No respiratory distress.     Breath sounds: Normal breath sounds. No wheezing, rhonchi or rales.  Neurological:     General: No focal deficit present.     Mental Status: He is alert and oriented to person, place, and time. Mental status is at baseline.       Ancillary Information    Past Medical History:  Diagnosis Date   BCC (basal cell carcinoma) 08/21/2017   left parietal scalp, Mohs   BCC (basal cell carcinoma) 08/21/2017   left medial canthus, Mohs   H/O seasonal allergies    Hyperlipidemia      No family history on file.   Past Surgical History:  Procedure Laterality Date   COLONOSCOPY WITH PROPOFOL N/A  09/08/2020   Procedure: COLONOSCOPY WITH PROPOFOL;  Surgeon: Pasty Spillers, MD;  Location: [facility] ENDOSCOPY;  Service: Endoscopy;  Laterality: N/A;  COVID POSITIVE 08/09/2020   WRIST SURGERY Right    pinning    Social History   Socioeconomic History   Marital status: Married    Spouse name: Not on file   Number of children: Not on file   Years of education: Not on file   Highest education level: Not on file  Occupational History   Not on file  Tobacco Use   Smoking status: Never   Smokeless tobacco: Never  Substance and Sexual Activity   Alcohol use: Not Currently    Alcohol/week: 0.0 standard drinks of alcohol   Drug use: Never   Sexual activity: Not on file  Other Topics Concern   Not on file  Social History Narrative   Not on file   Social Drivers of Health  Financial Resource Strain: Low Risk  (05/20/2023)   Overall Financial Resource Strain (CARDIA)    Difficulty of Paying Living Expenses: Not hard at all  Food Insecurity: No Food Insecurity (05/20/2023)   Hunger Vital Sign    Worried About Running Out of Food in the Last Year: Never true    Ran Out of Food in the Last Year: Never true  Transportation Needs: No Transportation Needs (05/20/2023)   PRAPARE - Administrator, Civil Service (Medical): No    Lack of Transportation (Non-Medical): No  Physical Activity: Sufficiently Active (05/20/2023)   Exercise Vital Sign    Days of Exercise per Week: 5 days    Minutes of Exercise per Session: 60 min  Stress: No Stress Concern Present (05/20/2023)   [employer] of Occupational Health - Occupational Stress Questionnaire    Feeling of Stress : Not at all  Social Connections: Moderately Integrated (05/20/2023)   Social Connection and Isolation Panel [NHANES]    Frequency of Communication with Friends and Family: More than three times a week    Frequency of Social Gatherings with Friends and Family: Twice a week    Attends Religious Services: More than 4  times per year    Active Member of Clubs or Organizations: No    Attends Banker Meetings: Never    Marital Status: Married  Catering manager Violence: Not At Risk (05/20/2023)   Humiliation, Afraid, Rape, and Kick questionnaire    Fear of Current or Ex-Partner: No    Emotionally Abused: No    Physically Abused: No    Sexually Abused: No     Allergies  Allergen Reactions   Cefdinir Nausea Only     CBC    Component Value Date/Time   WBC 5.5 04/29/2023 0905   RBC 5.27 04/29/2023 0905   HGB 15.9 04/29/2023 0905   HCT 49.6 04/29/2023 0905   PLT 219 04/29/2023 0905   MCV 94 04/29/2023 0905   MCH 30.2 04/29/2023 0905   MCHC 32.1 04/29/2023 0905   RDW 12.1 04/29/2023 0905   LYMPHSABS 1.5 04/29/2023 0905   EOSABS 0.2 04/29/2023 0905   BASOSABS 0.0 04/29/2023 0905    Pulmonary Functions Testing Results:     No data to display          Outpatient Medications Prior to Visit  Medication Sig Dispense Refill   atorvastatin (LIPITOR) 20 MG tablet TAKE 1 TABLET BY MOUTH EVERY DAY 90 tablet 0   losartan-hydrochlorothiazide (HYZAAR) 100-12.5 MG tablet Take 1 tablet by mouth daily. 14 tablet 0   albuterol (VENTOLIN HFA) 108 (90 Base) MCG/ACT inhaler Inhale 2 puffs into the lungs every 6 (six) hours as needed for wheezing or shortness of breath. (Patient not taking: Reported on 08/07/2023) 8 g 0   amoxicillin-clavulanate (AUGMENTIN) 875-125 MG tablet Take 1 tablet by mouth 2 (two) times daily. (Patient not taking: Reported on 08/07/2023) 14 tablet 0   benzonatate (TESSALON) 100 MG capsule Take 1 capsule (100 mg total) by mouth 2 (two) times daily as needed for cough. (Patient not taking: Reported on 08/07/2023) 20 capsule 0   No facility-administered medications prior to visit.

## 2023-08-26 DIAGNOSIS — S300XXA Contusion of lower back and pelvis, initial encounter: Secondary | ICD-10-CM | POA: Diagnosis not present

## 2023-09-23 ENCOUNTER — Ambulatory Visit: Payer: Self-pay | Admitting: Podiatry

## 2023-09-23 DIAGNOSIS — Q666 Other congenital valgus deformities of feet: Secondary | ICD-10-CM | POA: Diagnosis not present

## 2023-09-23 DIAGNOSIS — M722 Plantar fascial fibromatosis: Secondary | ICD-10-CM

## 2023-09-23 NOTE — Progress Notes (Signed)
 Subjective:  Patient ID: Jorge Knox, male    DOB: 13-Jan-1956,  MRN: [id number]  Chief Complaint  Patient presents with   Plantar Fasciitis    68 y.o. male presents with the above complaint.  Patient presents with complaint of right Planter fasciitis.  Patient has been going on for about a months.  It hurts all the time hurts with ambulation.  There is throbbing pain.  Pain scale is 10 out of 10 especially when he is about to go to sleep.  Morning pain nothing helps.  He has not seen anyone else prior to see me.  He would like to discuss treatment options.  He is open to getting injection.  He denies any orthotics.  He denies any other acute complaints   Review of Systems: Negative except as noted in the HPI. Denies N/V/F/Ch.  Past Medical History:  Diagnosis Date   BCC (basal cell carcinoma) 08/21/2017   left parietal scalp, Mohs   BCC (basal cell carcinoma) 08/21/2017   left medial canthus, Mohs   H/O seasonal allergies    Hyperlipidemia     Current Outpatient Medications:    albuterol (VENTOLIN HFA) 108 (90 Base) MCG/ACT inhaler, Inhale 2 puffs into the lungs every 6 (six) hours as needed for wheezing or shortness of breath. (Patient not taking: Reported on 08/07/2023), Disp: 8 g, Rfl: 0   atorvastatin (LIPITOR) 20 MG tablet, TAKE 1 TABLET BY MOUTH EVERY DAY, Disp: 90 tablet, Rfl: 0   loratadine (CLARITIN) 10 MG tablet, Take 1 tablet (10 mg total) by mouth daily., Disp: 30 tablet, Rfl: 2   losartan-hydrochlorothiazide (HYZAAR) 100-12.5 MG tablet, Take 1 tablet by mouth daily., Disp: 14 tablet, Rfl: 0  Social History   Tobacco Use  Smoking Status Never  Smokeless Tobacco Never    Allergies  Allergen Reactions   Cefdinir Nausea Only   Objective:  There were no vitals filed for this visit. There is no height or weight on file to calculate BMI. Constitutional Well developed. Well nourished.  Vascular Dorsalis pedis pulses palpable bilaterally. Posterior tibial pulses  palpable bilaterally. Capillary refill normal to all digits.  No cyanosis or clubbing noted. Pedal hair growth normal.  Neurologic Normal speech. Oriented to person, place, and time. Epicritic sensation to light touch grossly present bilaterally.  Dermatologic Nails well groomed and normal in appearance. No open wounds. No skin lesions.  Orthopedic: Normal joint ROM without pain or crepitus bilaterally. No visible deformities. Tender to palpation at the calcaneal tuber right. No pain with calcaneal squeeze right. Ankle ROM diminished range of motion right. Silfverskiold Test: positive right.   Radiographs: Taken and reviewed. No acute fractures or dislocations. No evidence of stress fracture.  Plantar heel spur absent. Posterior heel spur present.   Assessment:   No diagnosis found.  Plan:  Patient was evaluated and treated and all questions answered.  Plantar Fasciitis, right - XR reviewed as above.  - Educated on icing and stretching. Instructions given.  - Injection delivered to the plantar fascia as below. - DME: Plantar Fascial Brace - Pharmacologic management: None  Pes planovalgus -I explained to patient the etiology of pes planovalgus and relationship with Planter fasciitis and various treatment options were discussed.  Given patient foot structure in the setting of Planter fasciitis I believe patient will benefit from custom-made orthotics to help control the hindfoot motion support the arch of the foot and take the stress away from plantar fascial.  Patient agrees with the plan like to proceed  with orthotics -Patient was casted for orthotics   Procedure: Injection Tendon/Ligament Location: Right plantar fascia at the glabrous junction; medial approach. Skin Prep: alcohol Injectate: 0.5 cc 0.5% marcaine plain, 0.5 cc of 1% Lidocaine, 0.5 cc kenalog 10. Disposition: Patient tolerated procedure well. Injection site dressed with a band-aid.  No follow-ups on  file.

## 2023-10-03 ENCOUNTER — Telehealth: Payer: Self-pay

## 2023-10-03 DIAGNOSIS — K635 Polyp of colon: Secondary | ICD-10-CM

## 2023-10-03 NOTE — Telephone Encounter (Signed)
 Copied from CRM [id number]. Topic: Referral - Request for Referral >> Oct 03, 2023  3:48 PM Ivette P wrote: Did the patient discuss referral with their provider in the last year? Yes (If No - schedule appointment) (If Yes - send message)  Appointment offered? No  Type of order/referral and detailed reason for visit: Colonoscopy   Preference of office, provider, location: N/a, pt did not specify   If referral order, have you been seen by this specialty before? No (If Yes, this issue or another issue? When? Where?  Can we respond through MyChart? No, prefers to be called [phone]

## 2023-10-06 ENCOUNTER — Telehealth: Payer: Self-pay | Admitting: Family Medicine

## 2023-10-06 ENCOUNTER — Telehealth: Payer: Self-pay | Admitting: Physician Assistant

## 2023-10-06 ENCOUNTER — Telehealth: Payer: Self-pay

## 2023-10-06 ENCOUNTER — Other Ambulatory Visit: Payer: Self-pay

## 2023-10-06 DIAGNOSIS — I10 Essential (primary) hypertension: Secondary | ICD-10-CM

## 2023-10-06 DIAGNOSIS — Z860100 Personal history of colon polyps, unspecified: Secondary | ICD-10-CM

## 2023-10-06 MED ORDER — NA SULFATE-K SULFATE-MG SULF 17.5-3.13-1.6 GM/177ML PO SOLN: 1.0000 | Freq: Once | ORAL | 0 refills | Status: AC

## 2023-10-06 NOTE — Telephone Encounter (Signed)
 I've spoken with Mr. Penley and scheduled his colonoscopy with Dr. Servando Snare 10/30/23 at [facility].  Thanks, [place], [name]

## 2023-10-06 NOTE — Telephone Encounter (Signed)
 Patient advised. Verbalized understanding

## 2023-10-06 NOTE — Telephone Encounter (Signed)
[facility] faxed refill request for the following medications:  losartan-hydrochlorothiazide (HYZAAR) 100-12.5 MG tablet   Please advise.

## 2023-10-06 NOTE — Telephone Encounter (Signed)
 Gastroenterology Pre-Procedure Review  Request Date: 10/30/23 Requesting Physician: Dr. Servando Snare  PATIENT REVIEW QUESTIONS: The patient responded to the following health history questions as indicated:    1. Are you having any GI issues? no 2. Do you have a personal history of Polyps? yes (last colonoscopy performed by Dr. Maximino Greenland 09/08/20 recommended repeat in 3 years) 3. Do you have a family history of Colon Cancer or Polyps? no 4. Diabetes Mellitus? no 5. Joint replacements in the past 12 months?no 6. Major health problems in the past 3 months?no 7. Any artificial heart valves, MVP, or defibrillator?no    MEDICATIONS & ALLERGIES:    Patient reports the following regarding taking any anticoagulation/antiplatelet therapy:   Plavix, Coumadin, Eliquis, Xarelto, Lovenox, Pradaxa, Brilinta, or Effient? no Aspirin? no  Patient confirms/reports the following medications:  Current Outpatient Medications  Medication Sig Dispense Refill   atorvastatin (LIPITOR) 20 MG tablet TAKE 1 TABLET BY MOUTH EVERY DAY 90 tablet 0   losartan-hydrochlorothiazide (HYZAAR) 100-12.5 MG tablet Take 1 tablet by mouth daily. 14 tablet 0   albuterol (VENTOLIN HFA) 108 (90 Base) MCG/ACT inhaler Inhale 2 puffs into the lungs every 6 (six) hours as needed for wheezing or shortness of breath. (Patient not taking: Reported on 10/06/2023) 8 g 0   loratadine (CLARITIN) 10 MG tablet Take 1 tablet (10 mg total) by mouth daily. (Patient not taking: Reported on 10/06/2023) 30 tablet 2   No current facility-administered medications for this visit.    Patient confirms/reports the following allergies:  Allergies  Allergen Reactions   Cefdinir Nausea Only    No orders of the defined types were placed in this encounter.   AUTHORIZATION INFORMATION Primary Insurance: 1D#: Group #:  Secondary Insurance: 1D#: Group #:  SCHEDULE INFORMATION: Date: 10/30/23 Time: Location: [facility]

## 2023-10-06 NOTE — Telephone Encounter (Signed)
 Pls, let pt know that I placed a referral but pt can contact GI directly and schedule a follow-up colonoscopy with them as he is an established pt with GI Debera Lat, [facility], [facility] [phone] (phone) [phone] (fax)

## 2023-10-06 NOTE — Telephone Encounter (Signed)
 Pt requesting call back to schedule colonoscopy.

## 2023-10-07 MED ORDER — LOSARTAN POTASSIUM-HCTZ 100-12.5 MG PO TABS: 1.0000 | ORAL_TABLET | Freq: Every day | ORAL | 0 refills | Status: DC

## 2023-10-08 MED ORDER — LOSARTAN POTASSIUM-HCTZ 100-12.5 MG PO TABS: 1.0000 | ORAL_TABLET | Freq: Every day | ORAL | 0 refills | Status: DC

## 2023-10-08 NOTE — Telephone Encounter (Signed)
 Patient called stated he was advised that [facility]. Advised I did not see a note of that but the [facility] and there is a 3 business day turn around on refills so no later than Thursday the script should be sent to the pharmacy. Patient said he took his last pill on today and can't miss a day. Please f/u with patient

## 2023-10-08 NOTE — Addendum Note (Signed)
 Addended by: Marjie Skiff on: 10/08/2023 01:12 PM   Modules accepted: Orders

## 2023-10-08 NOTE — Telephone Encounter (Signed)
 Medication resend today. First one was placed as phone in.

## 2023-10-09 ENCOUNTER — Other Ambulatory Visit: Payer: Self-pay | Admitting: Family Medicine

## 2023-10-09 DIAGNOSIS — I10 Essential (primary) hypertension: Secondary | ICD-10-CM

## 2023-10-20 ENCOUNTER — Telehealth: Payer: Self-pay

## 2023-10-20 NOTE — Telephone Encounter (Signed)
 Sending orthos to [facility]

## 2023-10-23 ENCOUNTER — Ambulatory Visit: Admitting: Podiatry

## 2023-10-28 ENCOUNTER — Ambulatory Visit: Admitting: Podiatry

## 2023-10-28 ENCOUNTER — Telehealth: Payer: Self-pay

## 2023-10-28 NOTE — Telephone Encounter (Signed)
 Call has been returned.  LVM to let patient know message was received and his colonoscopy scheduled 10/30/23 has been canceled.  Vikki in Endo notified.  Thanks, [place], [name]

## 2023-10-28 NOTE — Telephone Encounter (Signed)
 Pt requesting call back cancelling procedure wife is having surgery on day of procedure

## 2023-10-30 ENCOUNTER — Ambulatory Visit: Admission: RE | Admit: 2023-10-30 | Source: Home / Self Care | Admitting: Gastroenterology

## 2023-10-30 SURGERY — COLONOSCOPY
Anesthesia: General

## 2023-11-03 ENCOUNTER — Telehealth: Payer: Self-pay | Admitting: Family Medicine

## 2023-11-03 MED ORDER — ATORVASTATIN CALCIUM 20 MG PO TABS: 20.0000 mg | ORAL_TABLET | Freq: Every day | ORAL | 0 refills | Status: DC

## 2023-11-03 NOTE — Telephone Encounter (Signed)
[facility] ref[facility] request for the following medications: ? ?atorvastatin (LIPITOR) 20 MG tablet  ? ?Please advise. ? ?

## 2023-11-13 ENCOUNTER — Ambulatory Visit (INDEPENDENT_AMBULATORY_CARE_PROVIDER_SITE_OTHER): Admitting: Podiatry

## 2023-11-13 DIAGNOSIS — M722 Plantar fascial fibromatosis: Secondary | ICD-10-CM

## 2023-11-13 DIAGNOSIS — Q666 Other congenital valgus deformities of feet: Secondary | ICD-10-CM

## 2023-11-19 ENCOUNTER — Telehealth: Payer: Self-pay

## 2023-11-19 NOTE — Progress Notes (Signed)
 Orthotics were dispensed they are functioning well no acute complaints.  They are fitting well

## 2023-11-19 NOTE — Telephone Encounter (Signed)
 Pt requesting call back to reschedule colonoscopy

## 2023-11-20 ENCOUNTER — Other Ambulatory Visit: Payer: Self-pay

## 2023-11-20 ENCOUNTER — Telehealth: Payer: Self-pay

## 2023-11-20 DIAGNOSIS — Z860100 Personal history of colon polyps, unspecified: Secondary | ICD-10-CM

## 2023-11-20 NOTE — Telephone Encounter (Signed)
 Call returned to patient to schedule his colonoscopy with Dr. Ole Berkeley.  He had to cancel last month due to his wife had to have surgery on her colon.  Colonoscopy has been scheduled with Dr. Ole Berkeley at [facility] on 11/25/23.  Referral updated.  Instructions updated in mychart.  Thanks, [name], [name]

## 2023-11-25 ENCOUNTER — Encounter: Payer: Self-pay | Admitting: Gastroenterology

## 2023-11-25 ENCOUNTER — Ambulatory Visit

## 2023-11-25 ENCOUNTER — Encounter
Admission: RE | Disposition: A | Discharge status: Home / Self Care | Payer: Self-pay | Source: Home / Self Care | Attending: Gastroenterology

## 2023-11-25 ENCOUNTER — Ambulatory Visit
Admission: RE | Admit: 2023-11-25 | Discharge: 2023-11-25 | Disposition: A | Discharge status: Home / Self Care | Attending: Gastroenterology | Admitting: Gastroenterology

## 2023-11-25 DIAGNOSIS — K640 First degree hemorrhoids: Secondary | ICD-10-CM | POA: Insufficient documentation

## 2023-11-25 DIAGNOSIS — Z860100 Personal history of colon polyps, unspecified: Secondary | ICD-10-CM | POA: Diagnosis not present

## 2023-11-25 DIAGNOSIS — K5730 Diverticulosis of large intestine without perforation or abscess without bleeding: Secondary | ICD-10-CM | POA: Insufficient documentation

## 2023-11-25 DIAGNOSIS — I10 Essential (primary) hypertension: Secondary | ICD-10-CM | POA: Insufficient documentation

## 2023-11-25 DIAGNOSIS — Z1211 Encounter for screening for malignant neoplasm of colon: Secondary | ICD-10-CM | POA: Diagnosis not present

## 2023-11-25 DIAGNOSIS — K635 Polyp of colon: Secondary | ICD-10-CM | POA: Diagnosis not present

## 2023-11-25 DIAGNOSIS — D124 Benign neoplasm of descending colon: Secondary | ICD-10-CM | POA: Insufficient documentation

## 2023-11-25 HISTORY — PX: POLYPECTOMY: SHX149

## 2023-11-25 HISTORY — PX: COLONOSCOPY: SHX5424

## 2023-11-25 SURGERY — COLONOSCOPY
Anesthesia: General

## 2023-11-25 MED ORDER — PROPOFOL 500 MG/50ML IV EMUL
INTRAVENOUS | Status: DC | PRN
  Administered 2023-11-25: 200 ug/kg/min via INTRAVENOUS
  Administered 2023-11-25: 100 mg via INTRAVENOUS

## 2023-11-25 MED ORDER — SODIUM CHLORIDE 0.9 % IV SOLN: INTRAVENOUS | Status: DC

## 2023-11-25 NOTE — Transfer of Care (Signed)
 Immediate Anesthesia Transfer of Care Note  Patient: Jorge Knox  Procedure(s) Performed: COLONOSCOPY  Patient Location: Endoscopy Unit  Anesthesia Type:General  Level of Consciousness: sedated  Airway & Oxygen Therapy: Patient Spontanous Breathing  Post-op Assessment: Report given to RN and Post -op Vital signs reviewed and stable  Post vital signs: Reviewed and stable  Last Vitals:  Vitals Value Taken Time  BP 87/59 11/25/23 1029  Temp 36.1 C 11/25/23 1029  Pulse 71 11/25/23 1031  Resp 19 11/25/23 1031  SpO2 94 % 11/25/23 1031  Vitals shown include unfiled device data.  Last Pain:  Vitals:   11/25/23 1029  TempSrc: Temporal  PainSc: Asleep         Complications: There were no known notable events for this encounter.

## 2023-11-25 NOTE — Op Note (Signed)
 [facility] Gastroenterology Patient Name: Jorge Knox Procedure Date: 11/25/2023 10:05 AM MRN: [id number] Account #: [id number] Date of Birth: 20-Feb-1956 Admit Type: Outpatient Age: 68 Room: [facility] ENDO ROOM 4 Gender: Male Note Status: Finalized Instrument Name: Hyman Main [id number] Procedure:             Colonoscopy Indications:           High risk colon cancer surveillance: Personal history                         of colonic polyps Providers:             Marnee Sink MD, MD Referring MD:          No Local Md, MD (Referring MD) Medicines:             Propofol  per Anesthesia Complications:         No immediate complications. Procedure:             Pre-Anesthesia Assessment:                        - Prior to the procedure, a History and Physical was                         performed, and patient medications and allergies were                         reviewed. The patient's tolerance of previous                         anesthesia was also reviewed. The risks and benefits                         of the procedure and the sedation options and risks                         were discussed with the patient. All questions were                         answered, and informed consent was obtained. Prior                         Anticoagulants: The patient has taken no anticoagulant                         or antiplatelet agents. ASA Grade Assessment: II - A                         patient with mild systemic disease. After reviewing                         the risks and benefits, the patient was deemed in                         satisfactory condition to undergo the procedure.                        After obtaining informed consent, the colonoscope was  passed under direct vision. Throughout the procedure,                         the patient's blood pressure, pulse, and oxygen                         saturations were monitored continuously. The                          Colonoscope was introduced through the anus and                         advanced to the the cecum, identified by appendiceal                         orifice and ileocecal valve. The colonoscopy was                         performed without difficulty. The patient tolerated                         the procedure well. The quality of the bowel                         preparation was excellent. Findings:      The perianal and digital rectal examinations were normal.      Two sessile polyps were found in the descending colon. The polyps were 2       to 3 mm in size. These polyps were removed with a cold biopsy forceps.       Resection and retrieval were complete.      A few small-mouthed diverticula were found in the sigmoid colon.      Non-bleeding internal hemorrhoids were found during retroflexion. The       hemorrhoids were Grade I (internal hemorrhoids that do not prolapse). Impression:            - Two 2 to 3 mm polyps in the descending colon,                         removed with a cold biopsy forceps. Resected and                         retrieved.                        - Diverticulosis in the sigmoid colon.                        - Non-bleeding internal hemorrhoids. Recommendation:        - Discharge patient to home.                        - Resume previous diet.                        - Continue present medications.                        - Await pathology results.                        -  If the pathology report reveals adenomatous tissue,                         then repeat the colonoscopy for surveillance in 7                         years. Procedure Code(s):     --- Professional ---                        [phone], Colonoscopy, flexible; with biopsy, single or                         multiple Diagnosis Code(s):     --- Professional ---                        Z86.010, Personal history of colonic polyps                        D12.4, Benign neoplasm of descending  colon CPT copyright 2022 American Medical Association. All rights reserved. The codes documented in this report are preliminary and upon coder review may  be revised to meet current compliance requirements. Marnee Sink MD, MD 11/25/2023 10:28:26 AM This report has been signed electronically. Number of Addenda: 0 Note Initiated On: 11/25/2023 10:05 AM Scope Withdrawal Time: 0 hours 6 minutes 55 seconds  Total Procedure Duration: 0 hours 8 minutes 15 seconds  Estimated Blood Loss:  Estimated blood loss: none.      [facility]

## 2023-11-25 NOTE — H&P (Signed)
 Marnee Sink, MD [facility] [address] [place], [place] [address] [phone] Fax : [phone]  Primary Care Physician:  Tasia Farr, FNP Primary Gastroenterologist:  Dr. Ole Berkeley  Pre-Procedure History & Physical: HPI:  Jorge Knox is a 68 y.o. male is here for an colonoscopy.   Past Medical History:  Diagnosis Date   BCC (basal cell carcinoma) 08/21/2017   left parietal scalp, Mohs   BCC (basal cell carcinoma) 08/21/2017   left medial canthus, Mohs   H/O seasonal allergies    Hyperlipidemia     Past Surgical History:  Procedure Laterality Date   COLONOSCOPY WITH PROPOFOL  N/A 09/08/2020   Procedure: COLONOSCOPY WITH PROPOFOL ;  Surgeon: Irby Mannan, MD;  Location: [facility] ENDOSCOPY;  Service: Endoscopy;  Laterality: N/A;  COVID POSITIVE 08/09/2020   WRIST SURGERY Right    pinning    Prior to Admission medications   Medication Sig Start Date End Date Taking? Authorizing Provider  atorvastatin  (LIPITOR) 20 MG tablet Take 1 tablet (20 mg total) by mouth daily. 11/03/23  Yes Clifton, Kellie A, FNP  losartan -hydrochlorothiazide (HYZAAR) 100-12.5 MG tablet Take 1 tablet by mouth daily. 10/08/23  Yes Pardue, Asencion Blacksmith, DO  albuterol  (VENTOLIN  HFA) 108 (90 Base) MCG/ACT inhaler Inhale 2 puffs into the lungs every 6 (six) hours as needed for wheezing or shortness of breath. Patient not taking: Reported on 10/06/2023 07/17/23   Tasia Farr, FNP  loratadine  (CLARITIN ) 10 MG tablet Take 1 tablet (10 mg total) by mouth daily. Patient not taking: Reported on 10/06/2023 08/07/23 08/06/24  Vergia Glasgow, MD    Allergies as of 11/20/2023 - Review Complete 11/13/2023  Allergen Reaction Noted   Cefdinir Nausea Only 09/18/2015    History reviewed. No pertinent family history.  Social History   Socioeconomic History   Marital status: Married    Spouse name: Not on file   Number of children: Not on file   Years of education: Not on file   Highest education  level: Not on file  Occupational History   Not on file  Tobacco Use   Smoking status: Never   Smokeless tobacco: Current    Types: Chew  Vaping Use   Vaping status: Never Used  Substance and Sexual Activity   Alcohol use: Not Currently    Alcohol/week: 0.0 standard drinks of alcohol   Drug use: Never   Sexual activity: Not on file  Other Topics Concern   Not on file  Social History Narrative   Not on file   Social Drivers of Health   Financial Resource Strain: Low Risk  (05/20/2023)   Overall Financial Resource Strain (CARDIA)    Difficulty of Paying Living Expenses: Not hard at all  Food Insecurity: No Food Insecurity (05/20/2023)   Hunger Vital Sign    Worried About Running Out of Food in the Last Year: Never true    Ran Out of Food in the Last Year: Never true  Transportation Needs: No Transportation Needs (05/20/2023)   PRAPARE - Administrator, Civil Service (Medical): No    Lack of Transportation (Non-Medical): No  Physical Activity: Sufficiently Active (05/20/2023)   Exercise Vital Sign    Days of Exercise per Week: 5 days    Minutes of Exercise per Session: 60 min  Stress: No Stress Concern Present (05/20/2023)   [employer] of Occupational Health - Occupational Stress Questionnaire    Feeling of Stress : Not at all  Social Connections: Moderately Integrated (05/20/2023)  Social Advertising account executive [NHANES]    Frequency of Communication with Friends and Family: More than three times a week    Frequency of Social Gatherings with Friends and Family: Twice a week    Attends Religious Services: More than 4 times per year    Active Member of [employer] or Organizations: No    Attends Banker Meetings: Never    Marital Status: Married  Catering manager Violence: Not At Risk (05/20/2023)   Humiliation, Afraid, Rape, and Kick questionnaire    Fear of Current or Ex-Partner: No    Emotionally Abused: No    Physically Abused: No     Sexually Abused: No    Review of Systems: See HPI, otherwise negative ROS  Physical Exam: BP 136/72   Pulse 63   Temp (!) 96.7 F (35.9 C) (Temporal)   Resp 16   Wt 111.9 kg   SpO2 98%   BMI 34.42 kg/m  General:   Alert,  pleasant and cooperative in NAD Head:  Normocephalic and atraumatic. Neck:  Supple; no masses or thyromegaly. Lungs:  Clear throughout to auscultation.    Heart:  Regular rate and rhythm. Abdomen:  Soft, nontender and nondistended. Normal bowel sounds, without guarding, and without rebound.   Neurologic:  Alert and  oriented x4;  grossly normal neurologically.  Impression/Plan: Jorge Knox is here for an colonoscopy to be performed for a history of adenomatous polyps on 2022   Risks, benefits, limitations, and alternatives regarding  colonoscopy have been reviewed with the patient.  Questions have been answered.  All parties agreeable.   Marnee Sink, MD  11/25/2023, 9:32 AM

## 2023-11-25 NOTE — Anesthesia Postprocedure Evaluation (Signed)
 Anesthesia Post Note  Patient: Jorge Knox  Procedure(s) Performed: COLONOSCOPY  Patient location during evaluation: Endoscopy Anesthesia Type: General Level of consciousness: awake and alert Pain management: pain level controlled Vital Signs Assessment: post-procedure vital signs reviewed and stable Respiratory status: spontaneous breathing, nonlabored ventilation, respiratory function stable and patient connected to nasal cannula oxygen Cardiovascular status: blood pressure returned to baseline and stable Postop Assessment: no apparent nausea or vomiting Anesthetic complications: no  There were no known notable events for this encounter.   Last Vitals:  Vitals:   11/25/23 1049 11/25/23 1059  BP: 97/66 (!) 99/56  Pulse: (!) 54 (!) 51  Resp:    Temp:    SpO2: 96% 96%    Last Pain:  Vitals:   11/25/23 1059  TempSrc:   PainSc: 0-No pain                 Enrique Harvest

## 2023-11-25 NOTE — Anesthesia Preprocedure Evaluation (Signed)
 Anesthesia Evaluation  Patient identified by MRN, date of birth, ID band  Airway Mallampati: II  TM Distance: >3 FB     Dental  (+) Teeth Intact   Pulmonary asthma  Seasonal allergies   Pulmonary exam normal        Cardiovascular hypertension, On Medications Normal cardiovascular exam     Neuro/Psych negative neurological ROS  negative psych ROS   GI/Hepatic negative GI ROS, Neg liver ROS,,,  Endo/Other  negative endocrine ROS    Renal/GU      Musculoskeletal   Abdominal   Peds  Hematology negative hematology ROS (+)   Anesthesia Other Findings Past Medical History: No date: H/O seasonal allergies No date: Hyperlipidemia  Reproductive/Obstetrics                             Anesthesia Physical Anesthesia Plan  ASA: 2  Anesthesia Plan: General   Post-op Pain Management: Minimal or no pain anticipated   Induction: Intravenous  PONV Risk Score and Plan: 3 and Propofol  infusion  Airway Management Planned: Nasal Cannula  Additional Equipment: None  Intra-op Plan:   Post-operative Plan:   Informed Consent: I have reviewed the patients History and Physical, chart, labs and discussed the procedure including the risks, benefits and alternatives for the proposed anesthesia with the patient or authorized representative who has indicated his/her understanding and acceptance.     Dental advisory given  Plan Discussed with: CRNA and Surgeon  Anesthesia Plan Comments: (Discussed risks of anesthesia with patient, including possibility of difficulty with spontaneous ventilation under anesthesia necessitating airway intervention, PONV, and rare risks such as cardiac or respiratory or neurological events, and allergic reactions. Discussed the role of CRNA in patient's perioperative care. Patient understands.)       Anesthesia Quick Evaluation

## 2023-11-26 LAB — SURGICAL PATHOLOGY

## 2023-11-27 ENCOUNTER — Ambulatory Visit: Payer: Self-pay | Admitting: Gastroenterology

## 2024-01-13 ENCOUNTER — Other Ambulatory Visit: Payer: Self-pay | Admitting: Family Medicine

## 2024-01-13 DIAGNOSIS — I10 Essential (primary) hypertension: Secondary | ICD-10-CM

## 2024-01-13 NOTE — Telephone Encounter (Signed)
 Copied from CRM [id number]. Topic: Clinical - Medication Refill >> Jan 13, 2024 10:45 AM Turkey B wrote: Medication: atorvastatin  (LIPITOR) 20 MG tablet  Has the patient contacted their pharmacy? yes  (Agent: If yes, when and what did the pharmacy advise?)contact pcp  This is the patient's preferred pharmacy:  [id number] - [place], [place] - [address] [place] [address] Phone: [phone] Fax: [phone]  Is this the correct pharmacy for this prescription? yes   Has the prescription been filled recently? no  Is the patient out of the medication? Has 1 left  Has the patient been seen for an appointment in the last year OR does the patient have an upcoming appointment? yes  Can we respond through MyChart? yes  Agent: Please be advised that Rx refills may take up to 3 business days. We ask that you follow-up with your pharmacy.

## 2024-01-14 ENCOUNTER — Other Ambulatory Visit: Payer: Self-pay

## 2024-01-14 MED ORDER — ATORVASTATIN CALCIUM 20 MG PO TABS
20.0000 mg | ORAL_TABLET | Freq: Every day | ORAL | 0 refills | Status: DC
Start: 2024-01-14 — End: 2024-04-11

## 2024-01-14 MED ORDER — LOSARTAN POTASSIUM-HCTZ 100-12.5 MG PO TABS: 1.0000 | ORAL_TABLET | Freq: Every day | ORAL | 0 refills | Status: DC

## 2024-01-14 NOTE — Telephone Encounter (Signed)
 Patient called in stating he is out of Atorvastatin  and almost out of Losartan -hydrochlorothiazide 100-12.5 mg.  [facility].

## 2024-01-14 NOTE — Telephone Encounter (Signed)
 Requested Prescriptions  Pending Prescriptions Disp Refills   atorvastatin  (LIPITOR) 20 MG tablet 90 tablet 0    Sig: Take 1 tablet (20 mg total) by mouth daily.     Cardiovascular:  Antilipid - Statins Failed - 01/14/2024 10:04 PM      Failed - Valid encounter within last 12 months    Recent Outpatient Visits   None     Future Appointments             In 3 months Claudene Lehmann, MD [facility]            Failed - Lipid Panel in normal range within the last 12 months    Cholesterol, Total  Date Value Ref Range Status  04/29/2023 125 100 - 199 mg/dL Final   LDL Chol Calc (NIH)  Date Value Ref Range Status  04/29/2023 79 0 - 99 mg/dL Final   HDL  Date Value Ref Range Status  04/29/2023 30 (L) >39 mg/dL Final   Triglycerides  Date Value Ref Range Status  04/29/2023 82 0 - 149 mg/dL Final         Passed - Patient is not pregnant       losartan -hydrochlorothiazide (HYZAAR) 100-12.5 MG tablet 90 tablet 0    Sig: Take 1 tablet by mouth daily.     Cardiovascular: ARB + Diuretic Combos Failed - 01/14/2024 10:04 PM      Failed - K in normal range and within 180 days    Potassium  Date Value Ref Range Status  04/29/2023 4.9 3.5 - 5.2 mmol/L Final         Failed - Na in normal range and within 180 days    Sodium  Date Value Ref Range Status  04/29/2023 142 134 - 144 mmol/L Final         Failed - Cr in normal range and within 180 days    Creatinine, Ser  Date Value Ref Range Status  04/29/2023 1.09 0.76 - 1.27 mg/dL Final         Failed - eGFR is 10 or above and within 180 days    GFR calc Af Amer  Date Value Ref Range Status  05/08/2020 86 >59 mL/min/1.73 Final    Comment:    **In accordance with recommendations from the NKF-ASN Task force,**   [facility] is in the process of updating its eGFR calculation to the   2021 CKD-EPI creatinine equation that estimates kidney function   without a race variable.    GFR calc non Af Amer  Date  Value Ref Range Status  05/08/2020 74 >59 mL/min/1.73 Final   eGFR  Date Value Ref Range Status  04/29/2023 75 >59 mL/min/1.73 Final         Failed - Valid encounter within last 6 months    Recent Outpatient Visits   None     Future Appointments             In 3 months Claudene Lehmann, MD [facility]            Passed - Patient is not pregnant      Passed - Last BP in normal range    BP Readings from Last 1 Encounters:  11/25/23 (!) 99/56

## 2024-04-11 ENCOUNTER — Other Ambulatory Visit: Payer: Self-pay | Admitting: Family Medicine

## 2024-04-11 DIAGNOSIS — I10 Essential (primary) hypertension: Secondary | ICD-10-CM

## 2024-04-22 ENCOUNTER — Ambulatory Visit: Payer: PPO | Admitting: Dermatology

## 2024-05-20 ENCOUNTER — Telehealth: Payer: Self-pay | Admitting: Family Medicine

## 2024-05-20 DIAGNOSIS — I10 Essential (primary) hypertension: Secondary | ICD-10-CM

## 2024-05-20 MED ORDER — ATORVASTATIN CALCIUM 20 MG PO TABS: 20.0000 mg | ORAL_TABLET | Freq: Every day | ORAL | 0 refills | Status: DC

## 2024-05-20 MED ORDER — LOSARTAN POTASSIUM-HCTZ 100-12.5 MG PO TABS: 1.0000 | ORAL_TABLET | Freq: Every day | ORAL | 0 refills | Status: DC

## 2024-05-20 NOTE — Telephone Encounter (Signed)
 Have sent prescription to [facility]

## 2024-05-20 NOTE — Telephone Encounter (Signed)
 [facility] faxed refill request for the following medications:   atorvastatin  (LIPITOR) 20 MG tablet   losartan -hydrochlorothiazide (HYZAAR) 100-12.5 MG tablet     Please advise.

## 2024-05-25 ENCOUNTER — Ambulatory Visit: Payer: Self-pay

## 2024-05-25 DIAGNOSIS — Z0000 Encounter for general adult medical examination without abnormal findings: Secondary | ICD-10-CM

## 2024-05-25 NOTE — Progress Notes (Signed)
 Subjective:   Jorge Knox is a 68 y.o. male who presents for a Medicare Annual Wellness Visit.  Allergies (verified) Cefdinir   History: Past Medical History:  Diagnosis Date   BCC (basal cell carcinoma) 08/21/2017   left parietal scalp, Mohs   BCC (basal cell carcinoma) 08/21/2017   left medial canthus, Mohs   H/O seasonal allergies    Hyperlipidemia    Past Surgical History:  Procedure Laterality Date   COLONOSCOPY N/A 11/25/2023   Procedure: COLONOSCOPY;  Surgeon: Jinny Carmine, MD;  Location: [facility] ENDOSCOPY;  Service: Endoscopy;  Laterality: N/A;   COLONOSCOPY WITH PROPOFOL  N/A 09/08/2020   Procedure: COLONOSCOPY WITH PROPOFOL ;  Surgeon: Janalyn Keene NOVAK, MD;  Location: [facility] ENDOSCOPY;  Service: Endoscopy;  Laterality: N/A;  COVID POSITIVE 08/09/2020   POLYPECTOMY  11/25/2023   Procedure: POLYPECTOMY, INTESTINE;  Surgeon: Jinny Carmine, MD;  Location: [facility] ENDOSCOPY;  Service: Endoscopy;;   WRIST SURGERY Right    pinning   No family history on file. Social History   Occupational History   Not on file  Tobacco Use   Smoking status: Never   Smokeless tobacco: Current    Types: Chew  Vaping Use   Vaping status: Never Used  Substance and Sexual Activity   Alcohol use: Not Currently    Alcohol/week: 0.0 standard drinks of alcohol   Drug use: Never   Sexual activity: Not on file   Tobacco Counseling Ready to quit: Not Answered Counseling given: Not Answered  SDOH Screenings   Food Insecurity: No Food Insecurity (05/20/2023)  Housing: Low Risk  (05/20/2023)  Transportation Needs: No Transportation Needs (05/20/2023)  Utilities: Not At Risk (05/20/2023)  Alcohol Screen: Low Risk  (05/20/2023)  Depression (PHQ2-9): Low Risk  (05/20/2023)  Financial Resource Strain: Low Risk  (05/20/2023)  Physical Activity: Sufficiently Active (05/20/2023)  Social Connections: Moderately Integrated (05/20/2023)  Stress: No Stress Concern Present (05/20/2023)  Tobacco Use: High Risk  (11/25/2023)  Health Literacy: Adequate Health Literacy (05/20/2023)   Depression Screen    05/20/2023    1:32 PM 04/29/2023    8:39 AM 03/27/2023    8:44 AM 10/17/2020    3:08 PM 05/04/2020    3:35 PM 09/04/2018    2:31 PM  PHQ 2/9 Scores  PHQ - 2 Score 0 0 0 0 0 0  PHQ- 9 Score 0  0  0  0   0      Data saved with a previous flowsheet row definition     Goals Addressed   None    Fall Screening Patient Fall Risk Level: Low fall risk        Objective:    There were no vitals filed for this visit. There is no height or weight on file to calculate BMI.  Current Medications (verified) Outpatient Encounter Medications as of 05/25/2024  Medication Sig   albuterol  (VENTOLIN  HFA) 108 (90 Base) MCG/ACT inhaler Inhale 2 puffs into the lungs every 6 (six) hours as needed for wheezing or shortness of breath. (Patient not taking: Reported on 10/06/2023)   atorvastatin  (LIPITOR) 20 MG tablet Take 1 tablet (20 mg total) by mouth daily.   loratadine  (CLARITIN ) 10 MG tablet Take 1 tablet (10 mg total) by mouth daily. (Patient not taking: Reported on 10/06/2023)   losartan -hydrochlorothiazide (HYZAAR) 100-12.5 MG tablet Take 1 tablet by mouth daily.   No facility-administered encounter medications on file as of 05/25/2024.   Hearing/Vision screen No results found. Immunizations and Health Maintenance Health Maintenance  Topic Date Due   COVID-19 Vaccine (1) Never done   Pneumococcal Vaccine: 50+ Years (1 of 2 - PCV) Never done   Zoster Vaccines- Shingrix (1 of 2) Never done   DTaP/Tdap/Td (2 - Td or Tdap) 04/18/2021   Influenza Vaccine  02/13/2024   Medicare Annual Wellness (AWV)  05/19/2024   Colonoscopy  11/25/2030   Hepatitis C Screening  Completed   Meningococcal B Vaccine  Aged Out        Assessment/Plan:  This is a routine wellness examination for Jorge Knox.  Patient Care Team: Wellington Curtis LABOR, FNP as PCP - General (Family Medicine)  I have personally reviewed and noted the  following in the patient's chart:   Medical and social history Use of alcohol, tobacco or illicit drugs  Current medications and supplements including opioid prescriptions. Functional ability and status Nutritional status Physical activity Advanced directives List of other physicians Hospitalizations, surgeries, and ER visits in previous 12 months Vitals Screenings to include cognitive, depression, and falls Referrals and appointments  No orders of the defined types were placed in this encounter.  In addition, I have reviewed and discussed with patient certain preventive protocols, quality metrics, and best practice recommendations. A written personalized care plan for preventive services as well as general preventive health recommendations were provided to patient.   Jorge GORMAN Das, LPN   [id number]/[id number]   No follow-ups on file.  After Visit Summary: (MyChart) Due to this being a telephonic visit, the after visit summary with patients personalized plan was offered to patient via MyChart   Nurse Notes: DECLINES ALL SHOTS; UTD ON COLONOSCOPY

## 2024-05-25 NOTE — Patient Instructions (Addendum)
 Jorge Knox,  Thank you for taking the time for your Medicare Wellness Visit. I appreciate your continued commitment to your health goals. Please review the care plan we discussed, and feel free to reach out if I can assist you further.  Please note that Annual Wellness Visits do not include a physical exam. Some assessments may be limited, especially if the visit was conducted virtually. If needed, we may recommend an in-person follow-up with your provider.  Ongoing Care Seeing your primary care provider every 3 to 6 months helps us  monitor your health and provide consistent, personalized care.   Referrals If a referral was made during today's visit and you haven't received any updates within two weeks, please contact the referred provider directly to check on the status.  Recommended Screenings:  Health Maintenance  Topic Date Due   COVID-19 Vaccine (1) Never done   Pneumococcal Vaccine for age over 24 (1 of 2 - PCV) Never done   Zoster (Shingles) Vaccine (1 of 2) Never done   DTaP/Tdap/Td vaccine (2 - Td or Tdap) 04/18/2021   Flu Shot  02/13/2024   Medicare Annual Wellness Visit  05/25/2025   Colon Cancer Screening  11/25/2030   Hepatitis C Screening  Completed   Meningitis B Vaccine  Aged Out     Vision: Annual vision screenings are recommended for early detection of glaucoma, cataracts, and diabetic retinopathy. These exams can also reveal signs of chronic conditions such as diabetes and high blood pressure.  Dental: Annual dental screenings help detect early signs of oral cancer, gum disease, and other conditions linked to overall health, including heart disease and diabetes.  Please see the attached documents for additional preventive care recommendations.   NEXT AWV  05/31/25 @ 11:30 AM BY VIDEO Take care & I will see ya next year/Jorge Knox

## 2024-05-27 NOTE — Progress Notes (Addendum)
 Subjective:   Jorge Knox is a 68 y.o. male who presents for a Medicare Annual Wellness Visit.  I connected with  Jorge Knox on 05/27/24 by a video and audio enabled telemedicine application and verified that I am speaking with the correct person using two identifiers.  Patient Location: Home  Provider Location: Office/Clinic  Persons Participating in Visit: Patient.  I discussed the limitations of evaluation and management by telemedicine. The patient expressed understanding and agreed to proceed.   Vital Signs: Because this visit was a virtual/telehealth visit, some criteria may be missing or patient reported. Any vitals not documented were not able to be obtained and vitals that have been documented are patient reported.      Allergies (verified) Cefdinir   History: Past Medical History:  Diagnosis Date   BCC (basal cell carcinoma) 08/21/2017   left parietal scalp, Mohs   BCC (basal cell carcinoma) 08/21/2017   left medial canthus, Mohs   H/O seasonal allergies    Hyperlipidemia    Past Surgical History:  Procedure Laterality Date   COLONOSCOPY N/A 11/25/2023   Procedure: COLONOSCOPY;  Surgeon: Jinny Carmine, MD;  Location: [facility] ENDOSCOPY;  Service: Endoscopy;  Laterality: N/A;   COLONOSCOPY WITH PROPOFOL  N/A 09/08/2020   Procedure: COLONOSCOPY WITH PROPOFOL ;  Surgeon: Janalyn Keene NOVAK, MD;  Location: [facility] ENDOSCOPY;  Service: Endoscopy;  Laterality: N/A;  COVID POSITIVE 08/09/2020   POLYPECTOMY  11/25/2023   Procedure: POLYPECTOMY, INTESTINE;  Surgeon: Jinny Carmine, MD;  Location: [facility] ENDOSCOPY;  Service: Endoscopy;;   WRIST SURGERY Right    pinning   History reviewed. No pertinent family history. Social History   Occupational History   Not on file  Tobacco Use   Smoking status: Never   Smokeless tobacco: Current    Types: Chew  Vaping Use   Vaping status: Never Used  Substance and Sexual Activity   Alcohol use: Not Currently    Alcohol/week:  0.0 standard drinks of alcohol   Drug use: Never   Sexual activity: Not on file   Tobacco Counseling Ready to quit: Not Answered Counseling given: Not Answered  SDOH Screenings   Food Insecurity: No Food Insecurity (05/25/2024)  Housing: Unknown (05/25/2024)  Transportation Needs: No Transportation Needs (05/25/2024)  Utilities: Not At Risk (05/25/2024)  Alcohol Screen: Low Risk  (05/20/2023)  Depression (PHQ2-9): Low Risk  (05/25/2024)  Financial Resource Strain: Low Risk  (05/20/2023)  Physical Activity: Sufficiently Active (05/25/2024)  Social Connections: Moderately Isolated (05/25/2024)  Stress: No Stress Concern Present (05/25/2024)  Tobacco Use: High Risk (05/25/2024)  Health Literacy: Adequate Health Literacy (05/25/2024)   Depression Screen    05/25/2024    2:02 PM 05/20/2023    1:32 PM 04/29/2023    8:39 AM 03/27/2023    8:44 AM 10/17/2020    3:08 PM 05/04/2020    3:35 PM 09/04/2018    2:31 PM  PHQ 2/9 Scores  PHQ - 2 Score 0 0 0 0 0 0 0  PHQ- 9 Score 0 0  0  0  0   0      Data saved with a previous flowsheet row definition     Goals Addressed             This Visit's Progress    DIET - INCREASE WATER INTAKE         Visit info / Clinical Intake: Medicare Wellness Visit Type:: Subsequent Annual Wellness Visit Persons participating in visit:: patient Information given by:: patient Interpreter Needed?:  No Pre-visit prep was completed: yes AWV questionnaire completed by patient prior to visit?: no Living arrangements:: lives with spouse/significant other Patient's Overall Health Status Rating: good Typical amount of pain: none Does pain affect daily life?: no Are you currently prescribed opioids?: no  Dietary Habits and Nutritional Risks How many meals a day?: (!) 1 Most meals are obtained by: preparing own meals In the last 2 weeks, have you had any of the following?: none Diabetic:: no  Functional Status Activities of Daily Living (to include  ambulation/medication): Independent Ambulation: Independent Medication Administration: Independent Home Management: Independent Manage your own finances?: yes Primary transportation is: driving Concerns about vision?: no *vision screening is required for WTM* Concerns about hearing?: no  Fall Screening Falls in the past year?: 0 Number of falls in past year: 0 Was there an injury with Fall?: 0 Fall Risk Category Calculator: 0 Patient Fall Risk Level: Low Fall Risk  Fall Risk Patient at Risk for Falls Due to: No Fall Risks Fall risk Follow up: Falls evaluation completed; Falls prevention discussed  Home and Transportation Safety: All rugs have non-skid backing?: yes All stairs or steps have railings?: yes Grab bars in the bathtub or shower?: (!) no Have non-skid surface in bathtub or shower?: (!) no Good home lighting?: yes Regular seat belt use?: yes Hospital stays in the last year:: no  Cognitive Assessment Difficulty concentrating, remembering, or making decisions? : no Will 6CIT or Mini Cog be Completed: yes What year is it?: 0 points What month is it?: 0 points Give patient an address phrase to remember (5 components): [address] [place], [place] About what time is it?: 0 points Count backwards from 20 to 1: 0 points Say the months of the year in reverse: 2 points Repeat the address phrase from earlier: 0 points 6 CIT Score: 2 points  Advance Directives (For Healthcare) Does Patient Have a Medical Advance Directive?: No Would patient like information on creating a medical advance directive?: No - Patient declined  Reviewed/Updated  Reviewed/Updated: Reviewed All (Medical, Surgical, Family, Medications, Allergies, Care Teams, Patient Goals)        Objective:    There were no vitals filed for this visit. There is no height or weight on file to calculate BMI.  Current Medications (verified) Outpatient Encounter Medications as of 05/25/2024  Medication Sig    albuterol  (VENTOLIN  HFA) 108 (90 Base) MCG/ACT inhaler Inhale 2 puffs into the lungs every 6 (six) hours as needed for wheezing or shortness of breath.   atorvastatin  (LIPITOR) 20 MG tablet Take 1 tablet (20 mg total) by mouth daily.   losartan -hydrochlorothiazide (HYZAAR) 100-12.5 MG tablet Take 1 tablet by mouth daily.   loratadine  (CLARITIN ) 10 MG tablet Take 1 tablet (10 mg total) by mouth daily. (Patient not taking: Reported on 05/25/2024)   No facility-administered encounter medications on file as of 05/25/2024.   Hearing/Vision screen Hearing Screening - Comments:: NO AIDS Vision Screening - Comments:: NO GLASSES  Immunizations and Health Maintenance Health Maintenance  Topic Date Due   COVID-19 Vaccine (1) Never done   Pneumococcal Vaccine: 50+ Years (1 of 2 - PCV) Never done   Zoster Vaccines- Shingrix (1 of 2) Never done   DTaP/Tdap/Td (2 - Td or Tdap) 04/18/2021   Influenza Vaccine  02/13/2024   Medicare Annual Wellness (AWV)  05/25/2025   Colonoscopy  11/25/2030   Hepatitis C Screening  Completed   Meningococcal B Vaccine  Aged Out  Assessment/Plan:  This is a routine wellness examination for Kashis.  Patient Care Team: Wellington Curtis LABOR, FNP as PCP - General (Family Medicine)  I have personally reviewed and noted the following in the patient's chart:   Medical and social history Use of alcohol, tobacco or illicit drugs  Current medications and supplements including opioid prescriptions. Functional ability and status Nutritional status Physical activity Advanced directives List of other physicians Hospitalizations, surgeries, and ER visits in previous 12 months Vitals Screenings to include cognitive, depression, and falls Referrals and appointments  No orders of the defined types were placed in this encounter.  In addition, I have reviewed and discussed with patient certain preventive protocols, quality metrics, and best practice recommendations. A  written personalized care plan for preventive services as well as general preventive health recommendations were provided to patient.   Jhonnie GORMAN Das, LPN   88/86/7974   Return in 1 year (on 05/25/2025).  After Visit Summary: (MyChart) Due to this being a telephonic visit, the after visit summary with patients personalized plan was offered to patient via MyChart   Nurse Notes: DECLINES ALL SHOTS; UTD ON COLONOSCOPY

## 2024-06-03 ENCOUNTER — Encounter: Payer: Self-pay | Admitting: Family Medicine

## 2024-06-03 ENCOUNTER — Ambulatory Visit: Admitting: Family Medicine

## 2024-06-03 VITALS — BP 115/73 | HR 67 | Temp 97.3°F | Ht 71.0 in | Wt 246.0 lb

## 2024-06-03 DIAGNOSIS — Z0000 Encounter for general adult medical examination without abnormal findings: Secondary | ICD-10-CM

## 2024-06-03 DIAGNOSIS — Z6834 Body mass index (BMI) 34.0-34.9, adult: Secondary | ICD-10-CM | POA: Diagnosis not present

## 2024-06-03 DIAGNOSIS — Z2821 Immunization not carried out because of patient refusal: Secondary | ICD-10-CM | POA: Diagnosis not present

## 2024-06-03 DIAGNOSIS — E6609 Other obesity due to excess calories: Secondary | ICD-10-CM

## 2024-06-03 DIAGNOSIS — E66811 Obesity, class 1: Secondary | ICD-10-CM | POA: Diagnosis not present

## 2024-06-03 DIAGNOSIS — E782 Mixed hyperlipidemia: Secondary | ICD-10-CM | POA: Diagnosis not present

## 2024-06-03 DIAGNOSIS — I10 Essential (primary) hypertension: Secondary | ICD-10-CM | POA: Diagnosis not present

## 2024-06-03 NOTE — Progress Notes (Signed)
 Complete physical exam  Patient: Jorge Knox   DOB: 1956/01/24   68 y.o. Male  MRN: [id number]  Subjective:    Chief Complaint  Patient presents with   Annual Exam    Patient is feeling well, sleeping well, patient is active and following a healthy diet.     Jorge Knox is a 68 y.o. male who presents today for a complete physical exam. He reports consuming a general diet. Home exercise routine includes staying busy around his land. He generally feels well. He reports sleeping well. He does not have additional problems to discuss today.   Discussed the use of AI scribe software for clinical note transcription with the patient, who gave verbal consent to proceed.  History of Present Illness Jorge Knox is a 68 year old male who presents for an annual physical exam.  He feels great and has no specific complaints. He adheres to a low-carb, high-protein diet consisting of salads and meat, avoiding sugar and using sweet and low as a substitute. He engages in daily physical activity, including raking leaves, climbing deer stands, and walking through his property.  He has an upcoming dental appointment for a root canal, which was postponed due to insurance changes. He was prescribed medication for this issue and reports no current pain.  He has received a COVID-19 vaccine and shingles shots. He reports never having had the flu or COVID-19 and is hesitant about receiving further vaccinations due to a dislike for needles.  He monitors his blood pressure at home, with recent readings around 128/72 to 115/70. He takes blood pressure medication daily and reports stable readings.  He had a colonoscopy in May.     Most recent fall risk assessment:    05/25/2024    1:56 PM  Fall Risk   Falls in the past year? 0  Number falls in past yr: 0  Injury with Fall? 0  Risk for fall due to : No Fall Risks  Follow up Falls evaluation completed;Falls prevention discussed     Most  recent depression screenings:    05/25/2024    2:02 PM 05/20/2023    1:32 PM  PHQ 2/9 Scores  PHQ - 2 Score 0 0  PHQ- 9 Score 0 0      Data saved with a previous flowsheet row definition   Vision:Not within last year  and Dental: Current dental problems and Receives regular dental care  Patient Active Problem List   Diagnosis Date Noted   History of colonic polyps 11/25/2023   Contusion of lower back 08/26/2023   Chronic cough 07/17/2023   Purulent bronchitis (HCC) 07/17/2023   Fever 07/04/2023   Encounter for screening prostate specific antigen (PSA) measurement 04/29/2023   Borderline diabetes 04/29/2023   Primary hypertension 02/17/2023   Elevated serum glucose 02/21/2022   Annual physical exam 02/21/2022   Ventral hernia without obstruction or gangrene 02/21/2022   Encounter for screening colonoscopy    Polyp of colon    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   BCC (basal cell carcinoma of skin) 09/04/2018   Hyperlipidemia 09/18/2015   Past Medical History:  Diagnosis Date   BCC (basal cell carcinoma) 08/21/2017   left parietal scalp, Mohs   BCC (basal cell carcinoma) 08/21/2017   left medial canthus, Mohs   H/O seasonal allergies    Hyperlipidemia    Past Surgical History:  Procedure Laterality Date   COLONOSCOPY N/A 11/25/2023   Procedure: COLONOSCOPY;  Surgeon: Jinny Carmine,  MD;  Location: [facility] ENDOSCOPY;  Service: Endoscopy;  Laterality: N/A;   COLONOSCOPY WITH PROPOFOL  N/A 09/08/2020   Procedure: COLONOSCOPY WITH PROPOFOL ;  Surgeon: Janalyn Keene NOVAK, MD;  Location: [facility] ENDOSCOPY;  Service: Endoscopy;  Laterality: N/A;  COVID POSITIVE 08/09/2020   POLYPECTOMY  11/25/2023   Procedure: POLYPECTOMY, INTESTINE;  Surgeon: Jinny Carmine, MD;  Location: [facility] ENDOSCOPY;  Service: Endoscopy;;   WRIST SURGERY Right    pinning   Social History   Tobacco Use   Smoking status: Never   Smokeless tobacco: Current    Types: Chew  Vaping Use   Vaping status: Never  Used  Substance Use Topics   Alcohol use: Not Currently    Alcohol/week: 0.0 standard drinks of alcohol   Drug use: Never   Allergies  Allergen Reactions   Cefdinir Nausea Only      Patient Care Team: Wellington Curtis LABOR, FNP as PCP - General (Family Medicine)   Outpatient Medications Prior to Visit  Medication Sig   atorvastatin  (LIPITOR) 20 MG tablet Take 1 tablet (20 mg total) by mouth daily.   losartan -hydrochlorothiazide (HYZAAR) 100-12.5 MG tablet Take 1 tablet by mouth daily.   albuterol  (VENTOLIN  HFA) 108 (90 Base) MCG/ACT inhaler Inhale 2 puffs into the lungs every 6 (six) hours as needed for wheezing or shortness of breath.   loratadine  (CLARITIN ) 10 MG tablet Take 1 tablet (10 mg total) by mouth daily. (Patient not taking: Reported on 05/25/2024)   No facility-administered medications prior to visit.    ROS        Objective:     BP 115/73 (BP Location: Left Arm, Patient Position: Sitting, Cuff Size: Large)   Pulse 67   Temp (!) 97.3 F (36.3 C) (Oral)   Ht 5' 11 (1.803 m)   Wt 246 lb (111.6 kg)   SpO2 97%   BMI 34.31 kg/m  BP Readings from Last 3 Encounters:  06/03/24 115/73  11/25/23 (!) 99/56  08/07/23 126/76   Wt Readings from Last 3 Encounters:  06/03/24 246 lb (111.6 kg)  11/25/23 246 lb 12.8 oz (111.9 kg)  08/07/23 250 lb 3.2 oz (113.5 kg)      Physical Exam Constitutional:      General: He is not in acute distress.    Appearance: Normal appearance. He is obese. He is not ill-appearing, toxic-appearing or diaphoretic.  HENT:     Head: Normocephalic.     Right Ear: Tympanic membrane, ear canal and external ear normal. There is no impacted cerumen.     Left Ear: Tympanic membrane, ear canal and external ear normal. There is no impacted cerumen.     Nose: Nose normal. No congestion or rhinorrhea.     Mouth/Throat:     Mouth: Mucous membranes are moist.     Pharynx: Oropharynx is clear. No oropharyngeal exudate or posterior oropharyngeal  erythema.  Eyes:     General: Lids are normal.     Extraocular Movements: Extraocular movements intact.     Right eye: Normal extraocular motion.     Left eye: Normal extraocular motion.     Conjunctiva/sclera: Conjunctivae normal.     Right eye: Right conjunctiva is not injected.     Left eye: Left conjunctiva is not injected.     Pupils: Pupils are equal, round, and reactive to light.  Neck:     Thyroid : No thyroid  mass, thyromegaly or thyroid  tenderness.  Cardiovascular:     Rate and Rhythm: Normal rate.  Pulses: Normal pulses.          Radial pulses are 2+ on the right side and 2+ on the left side.       Posterior tibial pulses are 2+ on the right side and 2+ on the left side.     Heart sounds: Normal heart sounds, S1 normal and S2 normal. No murmur heard.    No friction rub. No gallop.  Pulmonary:     Effort: Pulmonary effort is normal. No respiratory distress.     Breath sounds: Normal breath sounds. No stridor. No wheezing, rhonchi or rales.  Abdominal:     General: Bowel sounds are normal. There is no distension.     Palpations: Abdomen is soft. There is no mass.     Tenderness: There is no abdominal tenderness. There is no guarding or rebound.     Hernia: No hernia is present.  Musculoskeletal:        General: No swelling or tenderness. Normal range of motion.     Cervical back: Normal range of motion. No rigidity.     Right lower leg: No edema.     Left lower leg: No edema.  Lymphadenopathy:     Cervical: No cervical adenopathy.     Right cervical: No superficial, deep or posterior cervical adenopathy.    Left cervical: No superficial, deep or posterior cervical adenopathy.  Skin:    General: Skin is warm and dry.     Capillary Refill: Capillary refill takes less than 2 seconds.     Findings: No bruising or erythema.  Neurological:     General: No focal deficit present.     Mental Status: He is alert and oriented to person, place, and time. Mental status is at  baseline.     GCS: GCS eye subscore is 4. GCS verbal subscore is 5. GCS motor subscore is 6.     Cranial Nerves: No cranial nerve deficit.     Sensory: No sensory deficit.     Motor: No weakness, tremor or pronator drift.     Coordination: Romberg sign negative. Coordination normal.     Gait: Gait is intact. Gait normal.  Psychiatric:        Attention and Perception: Attention and perception normal.        Mood and Affect: Mood and affect normal.        Speech: Speech normal.        Behavior: Behavior normal. Behavior is cooperative.        Thought Content: Thought content normal.        Cognition and Memory: Cognition and memory normal.        Judgment: Judgment normal.      No results found for any visits on 06/03/24.     Assessment & Plan:    Routine Health Maintenance and Physical Exam  Assessment and Plan Assessment & Plan Adult Wellness Visit Routine adult wellness visit with no acute concerns. He maintains a diet low in carbohydrates and high in protein, engages in regular physical activity, and reports good vision and dental health. He is up to date on colonoscopy and declines flu and shingles vaccines due to personal beliefs and needle aversion. He is aware of the risks of tetanus and prefers to avoid unnecessary vaccinations. - Ordered lab work including lipid panel, electrolytes, kidney function, and A1c  - Declines flu, tdap, or second dose of shingles - educated on vaccines and purpose  Things to do to keep yourself  healthy  - Exercise at least 30-45 minutes a day, 3-4 days a week.  - Eat a low-fat diet with lots of fruits and vegetables, up to 7-9 servings per day.  - Seatbelts can save your life. Wear them always.  - Smoke detectors on every level of your home, check batteries every year.  - Eye Doctor - have an eye exam every 1-2 years  - Safe sex - if you may be exposed to STDs, use a condom.  - Alcohol -  If you drink, do it moderately, less than 2 drinks per  day.  - Health Care Power of Attorney. Choose someone to speak for you if you are not able.  - Depression is common in our stressful world.If you're feeling down or losing interest in things you normally enjoy, please come in for a visit.  - Violence - If anyone is threatening or hurting you, please call immediately.   Essential hypertension Hypertension is well-controlled with current medication regimen. Home blood pressure readings are consistently around 115/72-74 mmHg. - Continue losartan -hydrochlorothiazide 100/12.5mg  daily - Monitor blood pressure at home regularly - check CMP today - Follow up in six months for blood pressure check  Mixed hyperlipidemia - continue with atorvastatin  20mg  daily - Ordered lipid panel today - continue efforts in exercise and diet  Obesity, class 1 Class 1 obesity managed with dietary modifications and regular physical activity by being busy on his land. He adheres to a low-carbohydrate, high-protein diet and engages in daily physical activity. Continue to make conscious decisions for well balanced diet smaller portions with increase protein, fruits, veggies, water as drink of choice, decrease starches, processed foods, and saturated fats. Weekly purposeful exercise - 150 minutes per week.     Health Maintenance  Topic Date Due   COVID-19 Vaccine (1) Never done   Pneumococcal Vaccine for age over 37 (1 of 2 - PCV) Never done   Zoster (Shingles) Vaccine (1 of 2) Never done   DTaP/Tdap/Td vaccine (2 - Td or Tdap) 04/18/2021   Flu Shot  02/13/2024   Medicare Annual Wellness Visit  05/25/2025   Colon Cancer Screening  11/25/2030   Hepatitis C Screening  Completed   Meningitis B Vaccine  Aged Out    Discussed health benefits of physical activity, and encouraged him to engage in regular exercise appropriate for his age and condition.  There are no diagnoses linked to this encounter.   No follow-ups on file.     Curtis DELENA Boom, FNP

## 2024-06-04 ENCOUNTER — Ambulatory Visit: Payer: Self-pay | Admitting: Family Medicine

## 2024-06-04 LAB — COMPREHENSIVE METABOLIC PANEL WITH GFR
ALT: 35 IU/L (ref 0–44)
AST: 30 IU/L (ref 0–40)
Albumin: 4.5 g/dL (ref 3.9–4.9)
Alkaline Phosphatase: 58 IU/L (ref 47–123)
BUN/Creatinine Ratio: 20 (ref 10–24)
BUN: 20 mg/dL (ref 8–27)
Bilirubin Total: 0.9 mg/dL (ref 0.0–1.2)
CO2: 23 mmol/L (ref 20–29)
Calcium: 10.1 mg/dL (ref 8.6–10.2)
Chloride: 105 mmol/L (ref 96–106)
Creatinine, Ser: 1.01 mg/dL (ref 0.76–1.27)
Globulin, Total: 2.2 g/dL (ref 1.5–4.5)
Glucose: 98 mg/dL (ref 70–99)
Potassium: 4.9 mmol/L (ref 3.5–5.2)
Sodium: 141 mmol/L (ref 134–144)
Total Protein: 6.7 g/dL (ref 6.0–8.5)
eGFR: 82 mL/min/1.73 (ref >59)

## 2024-06-04 LAB — CBC WITH DIFFERENTIAL/PLATELET
Basophils Absolute: 0.1 x10E3/uL (ref 0.0–0.2)
Basos: 1 %
EOS (ABSOLUTE): 0.2 x10E3/uL (ref 0.0–0.4)
Eos: 3 %
Hematocrit: 47.9 % (ref 37.5–51.0)
Hemoglobin: 15.9 g/dL (ref 13.0–17.7)
Immature Grans (Abs): 0 x10E3/uL (ref 0.0–0.1)
Immature Granulocytes: 0 %
Lymphocytes Absolute: 1.2 x10E3/uL (ref 0.7–3.1)
Lymphs: 20 %
MCH: 31.7 pg (ref 26.6–33.0)
MCHC: 33.2 g/dL (ref 31.5–35.7)
MCV: 95 fL (ref 79–97)
Monocytes Absolute: 0.5 x10E3/uL (ref 0.1–0.9)
Monocytes: 9 %
Neutrophils Absolute: 4.1 x10E3/uL (ref 1.4–7.0)
Neutrophils: 67 %
Platelets: 279 x10E3/uL (ref 150–450)
RBC: 5.02 x10E6/uL (ref 4.14–5.80)
RDW: 12.2 % (ref 11.6–15.4)
WBC: 6.1 x10E3/uL (ref 3.4–10.8)

## 2024-06-04 LAB — PSA: Prostate Specific Ag, Serum: 3.4 ng/mL (ref 0.0–4.0)

## 2024-06-04 LAB — LIPID PANEL WITH LDL/HDL RATIO
Cholesterol, Total: 119 mg/dL (ref 100–199)
HDL: 31 mg/dL — ABNORMAL LOW (ref >39)
LDL Chol Calc (NIH): 74 mg/dL (ref 0–99)
LDL/HDL Ratio: 2.4 ratio (ref 0.0–3.6)
Triglycerides: 64 mg/dL (ref 0–149)
VLDL Cholesterol Cal: 14 mg/dL (ref 5–40)

## 2024-06-04 LAB — HEMOGLOBIN A1C
Est. average glucose Bld gHb Est-mCnc: 117 mg/dL
Hgb A1c MFr Bld: 5.7 % — ABNORMAL HIGH (ref 4.8–5.6)

## 2024-06-11 ENCOUNTER — Telehealth: Payer: Self-pay

## 2024-07-30 NOTE — Telephone Encounter (Signed)
 SABRA

## 2024-08-20 ENCOUNTER — Other Ambulatory Visit: Payer: Self-pay | Admitting: Family Medicine

## 2024-08-20 ENCOUNTER — Telehealth: Payer: Self-pay | Admitting: Family Medicine

## 2024-08-20 DIAGNOSIS — I10 Essential (primary) hypertension: Secondary | ICD-10-CM

## 2024-08-20 MED ORDER — LOSARTAN POTASSIUM-HCTZ 100-12.5 MG PO TABS: 1.0000 | ORAL_TABLET | Freq: Every day | ORAL | 0 refills | Status: AC

## 2024-08-20 MED ORDER — ATORVASTATIN CALCIUM 20 MG PO TABS: 20.0000 mg | ORAL_TABLET | Freq: Every day | ORAL | 0 refills | Status: AC

## 2024-08-20 NOTE — Telephone Encounter (Signed)
 Copied from CRM #[id number]. Topic: Clinical - Medication Refill >> Aug 20, 2024  2:44 PM Delon T wrote: Medication: atorvastatin  (LIPITOR) 20 MG tablet losartan -hydrochlorothiazide (HYZAAR) 100-12.5 MG tablet  Has the patient contacted their pharmacy? No (Agent: If no, request that the patient contact the pharmacy for the refill. If patient does not wish to contact the pharmacy document the reason why and proceed with request.) (Agent: If yes, when and what did the pharmacy advise?)  This is the patient's preferred pharmacy:    [facility] - [place], [place] - [address] [place] [address] Phone: [phone] Fax: [phone]  Is this the correct pharmacy for this prescription? Yes If no, delete pharmacy and type the correct one.   Has the prescription been filled recently? Yes  Is the patient out of the medication? Yes  Has the patient been seen for an appointment in the last year OR does the patient have an upcoming appointment? Yes  Can we respond through MyChart? Yes  Agent: Please be advised that Rx refills may take up to 3 business days. We ask that you follow-up with your pharmacy.

## 2024-08-20 NOTE — Telephone Encounter (Signed)
 Medication sent in, 90 day supply sent until he establish's care with Dr. Franchot in May

## 2024-08-20 NOTE — Telephone Encounter (Addendum)
 Refill Request   Pharmacy: [facility]  Medication: atorvastatin  (LIPITOR) 20 MG tablet [19178] losartan -hydrochlorothiazide (HYZAAR) 100-12.5 MG tablet   Quantity (if provided): 90  Please send in refill request.

## 2024-12-02 ENCOUNTER — Encounter

## 2024-12-10 ENCOUNTER — Encounter

## 2025-05-31 ENCOUNTER — Ambulatory Visit
